# Patient Record
Sex: Male | Born: 1935 | Race: White | Hispanic: No | Marital: Married | Smoking: Never smoker
Health system: Southern US, Community
[De-identification: ages and names within clinical notes are randomized; demographics above are authoritative.]

## PROBLEM LIST (undated history)

## (undated) DIAGNOSIS — C801 Malignant (primary) neoplasm, unspecified: Secondary | ICD-10-CM

## (undated) DIAGNOSIS — E785 Hyperlipidemia, unspecified: Secondary | ICD-10-CM

---

## 1963-07-16 HISTORY — PX: PARTIAL GASTRECTOMY: SHX2172

## 1993-07-15 HISTORY — PX: TRANSURETHRAL RESECTION OF PROSTATE: SHX73

## 2003-07-01 ENCOUNTER — Ambulatory Visit (HOSPITAL_COMMUNITY): Admission: RE | Admit: 2003-07-01 | Discharge: 2003-07-01 | Payer: Self-pay | Admitting: Internal Medicine

## 2005-07-15 HISTORY — PX: CATARACT EXTRACTION: SUR2

## 2006-07-15 HISTORY — PX: CORNEAL TRANSPLANT: SHX108

## 2007-08-03 ENCOUNTER — Ambulatory Visit (HOSPITAL_COMMUNITY): Admission: RE | Admit: 2007-08-03 | Discharge: 2007-08-03 | Payer: Self-pay | Admitting: Internal Medicine

## 2008-08-03 ENCOUNTER — Encounter (HOSPITAL_COMMUNITY): Admission: RE | Admit: 2008-08-03 | Discharge: 2008-11-01 | Payer: Self-pay | Admitting: Internal Medicine

## 2009-05-11 ENCOUNTER — Encounter (HOSPITAL_COMMUNITY): Admission: RE | Admit: 2009-05-11 | Discharge: 2009-06-10 | Payer: Self-pay | Admitting: Urology

## 2009-05-23 ENCOUNTER — Ambulatory Visit: Admission: RE | Admit: 2009-05-23 | Discharge: 2009-06-28 | Payer: Self-pay | Admitting: Radiation Oncology

## 2009-07-15 DIAGNOSIS — C801 Malignant (primary) neoplasm, unspecified: Secondary | ICD-10-CM

## 2009-07-15 HISTORY — DX: Malignant (primary) neoplasm, unspecified: C80.1

## 2009-07-25 ENCOUNTER — Ambulatory Visit
Admission: RE | Admit: 2009-07-25 | Discharge: 2009-10-20 | Payer: Self-pay | Source: Home / Self Care | Admitting: Radiation Oncology

## 2009-08-08 ENCOUNTER — Ambulatory Visit (HOSPITAL_COMMUNITY): Admission: RE | Admit: 2009-08-08 | Discharge: 2009-08-08 | Payer: Self-pay | Admitting: Internal Medicine

## 2010-08-21 ENCOUNTER — Ambulatory Visit (HOSPITAL_COMMUNITY): Payer: Medicare Other | Attending: Internal Medicine

## 2010-08-21 DIAGNOSIS — M810 Age-related osteoporosis without current pathological fracture: Secondary | ICD-10-CM | POA: Insufficient documentation

## 2010-11-30 NOTE — Op Note (Signed)
NAME:  [name], [name]                       ACCOUNT NO.:  [id number]   MEDICAL RECORD NO.:  [id number]                   PATIENT TYPE:  AMB   LOCATION:  DAY                                  FACILITY:  [facility]   PHYSICIAN:  Lionel December, M.D.                 DATE OF BIRTH:  Apr 15, 1926   DATE OF PROCEDURE:  07/01/2003  DATE OF DISCHARGE:                                 OPERATIVE REPORT   PROCEDURE:  Total colonoscopy.   INDICATIONS FOR PROCEDURE:  [name] [name] is a 77 year old Caucasian male who is  undergoing screening colonoscopy.  Family history is negative for colorectal  carcinoma.  He does not have any GI symptoms.  He states he generally has 3-  4 bowel movements per day which has been patterned for years and stools are  generally formed.  The procedure is reviewed with the patient and informed  consent was obtained.   PREOP MEDICATIONS:  Demerol 25 mg IV, Versed 2 mg IV.   FINDINGS:  The procedure performed in endoscopy suite.  The patient's vital  signs and O2 sat were monitored during the procedure and remained stable.  The patient was placed in the left lateral decubitus position and a rectal  examination performed.  No abnormality noted on external or digital exam.  The Olympus videoscope was placed in the rectum and advanced under direct  vision in the sigmoid colon and beyond.  Preparation was excellent.  The  scope was passed in the cecum which was identified by appendiceal orifice  and the ileocecal valve.  Pictures taken for the record. As the scope was  withdrawn, the colonic mucosa was once again carefully examined. There was a  3 mm polyp in the area of the proximal transverse colon which was ablated  via cold biopsy.  The mucosa of the rest of the colon was normal except a  few diverticula at the sigmoid colon which were tiny. The rectal mucosa was  normal. The scope was retroflexed to examine the anorectal junction and  hemorrhoids were noted below the dentate line.  These were felt to be small.  The endoscope was straightened and withdrawn.  The patient tolerated the  procedure well.   FINAL DIAGNOSES:  1. Examination performed to the cecum.  2. Small polyp ablated via cold biopsy from the proximal transverse colon.  3. A few tiny diverticula at the sigmoid colon and small external     hemorrhoids.   RECOMMENDATIONS:  1. High fiber diet.  2. I will be contacting the patient with biopsy results and further     recommendations.      ___________________________________________                                            Lionel December, M.D.   NR/MEDQ  D:  07/01/2003  T:  07/02/2003  Job:  [id number]   cc:   Loraine Leriche A. Waynard Edwards, M.D.  [address]  [place]  [place] [address]  Fax: [phone]

## 2011-01-29 ENCOUNTER — Ambulatory Visit (INDEPENDENT_AMBULATORY_CARE_PROVIDER_SITE_OTHER): Payer: Medicare Other | Admitting: Urology

## 2011-01-29 DIAGNOSIS — C61 Malignant neoplasm of prostate: Secondary | ICD-10-CM

## 2011-06-04 ENCOUNTER — Ambulatory Visit (INDEPENDENT_AMBULATORY_CARE_PROVIDER_SITE_OTHER): Payer: Medicare Other | Admitting: Urology

## 2011-06-04 DIAGNOSIS — C61 Malignant neoplasm of prostate: Secondary | ICD-10-CM

## 2011-09-17 ENCOUNTER — Other Ambulatory Visit (HOSPITAL_COMMUNITY): Payer: Self-pay | Admitting: *Deleted

## 2011-09-18 ENCOUNTER — Ambulatory Visit (HOSPITAL_COMMUNITY)
Admission: RE | Admit: 2011-09-18 | Discharge: 2011-09-18 | Disposition: A | Discharge status: Home / Self Care | Payer: Medicare Other | Source: Ambulatory Visit | Attending: Internal Medicine | Admitting: Internal Medicine

## 2011-09-18 ENCOUNTER — Encounter (HOSPITAL_COMMUNITY): Payer: Self-pay

## 2011-09-18 DIAGNOSIS — M810 Age-related osteoporosis without current pathological fracture: Secondary | ICD-10-CM | POA: Insufficient documentation

## 2011-09-18 HISTORY — DX: Malignant (primary) neoplasm, unspecified: C80.1

## 2011-09-18 HISTORY — DX: Hyperlipidemia, unspecified: E78.5

## 2011-09-18 MED ORDER — ZOLEDRONIC ACID 5 MG/100ML IV SOLN
5.0000 mg | Freq: Once | INTRAVENOUS | Status: AC
  Administered 2011-09-18: 5 mg via INTRAVENOUS
  Filled 2011-09-18: qty 100

## 2011-09-18 MED ORDER — SODIUM CHLORIDE 0.9 % IV SOLN: INTRAVENOUS | Status: AC

## 2011-09-18 NOTE — Discharge Instructions (Signed)
May take tylenol or advil.  Contact MD for jaw pain. Continue drinking fluids today

## 2011-11-26 ENCOUNTER — Ambulatory Visit (INDEPENDENT_AMBULATORY_CARE_PROVIDER_SITE_OTHER): Payer: Medicare Other | Admitting: Urology

## 2011-11-26 DIAGNOSIS — C61 Malignant neoplasm of prostate: Secondary | ICD-10-CM

## 2011-12-02 ENCOUNTER — Telehealth: Payer: Self-pay

## 2012-06-02 ENCOUNTER — Ambulatory Visit (INDEPENDENT_AMBULATORY_CARE_PROVIDER_SITE_OTHER): Payer: Medicare Other | Admitting: Urology

## 2012-06-02 DIAGNOSIS — C61 Malignant neoplasm of prostate: Secondary | ICD-10-CM

## 2012-06-09 ENCOUNTER — Other Ambulatory Visit: Payer: Self-pay | Admitting: Urology

## 2012-06-09 DIAGNOSIS — C61 Malignant neoplasm of prostate: Secondary | ICD-10-CM

## 2012-06-15 ENCOUNTER — Encounter (HOSPITAL_COMMUNITY): Payer: Self-pay

## 2012-06-15 ENCOUNTER — Encounter (HOSPITAL_COMMUNITY)
Admission: RE | Admit: 2012-06-15 | Discharge: 2012-06-15 | Disposition: A | Discharge status: Home / Self Care | Payer: Medicare Other | Source: Ambulatory Visit | Attending: Urology | Admitting: Urology

## 2012-06-15 DIAGNOSIS — C61 Malignant neoplasm of prostate: Secondary | ICD-10-CM | POA: Insufficient documentation

## 2012-06-15 MED ORDER — TECHNETIUM TC 99M MEDRONATE IV KIT
25.0000 | PACK | Freq: Once | INTRAVENOUS | Status: AC | PRN
  Administered 2012-06-15: 27 via INTRAVENOUS

## 2012-06-17 ENCOUNTER — Ambulatory Visit (HOSPITAL_COMMUNITY)
Admission: RE | Admit: 2012-06-17 | Discharge: 2012-06-17 | Disposition: A | Discharge status: Home / Self Care | Payer: Medicare Other | Source: Ambulatory Visit | Attending: Urology | Admitting: Urology

## 2012-06-17 DIAGNOSIS — Z923 Personal history of irradiation: Secondary | ICD-10-CM | POA: Insufficient documentation

## 2012-06-17 DIAGNOSIS — C61 Malignant neoplasm of prostate: Secondary | ICD-10-CM | POA: Insufficient documentation

## 2012-06-17 MED ORDER — IOHEXOL 300 MG/ML  SOLN
100.0000 mL | Freq: Once | INTRAMUSCULAR | Status: AC | PRN
  Administered 2012-06-17: 100 mL via INTRAVENOUS

## 2012-12-01 ENCOUNTER — Ambulatory Visit (INDEPENDENT_AMBULATORY_CARE_PROVIDER_SITE_OTHER): Payer: Medicare Other | Admitting: Urology

## 2012-12-01 DIAGNOSIS — K4090 Unilateral inguinal hernia, without obstruction or gangrene, not specified as recurrent: Secondary | ICD-10-CM

## 2012-12-01 DIAGNOSIS — C61 Malignant neoplasm of prostate: Secondary | ICD-10-CM

## 2012-12-08 ENCOUNTER — Other Ambulatory Visit: Payer: Self-pay | Admitting: Urology

## 2012-12-08 ENCOUNTER — Ambulatory Visit (INDEPENDENT_AMBULATORY_CARE_PROVIDER_SITE_OTHER): Payer: Medicare Other | Admitting: Urology

## 2012-12-08 DIAGNOSIS — C61 Malignant neoplasm of prostate: Secondary | ICD-10-CM

## 2013-01-04 ENCOUNTER — Encounter (HOSPITAL_COMMUNITY): Payer: Self-pay

## 2013-01-04 ENCOUNTER — Encounter (HOSPITAL_COMMUNITY)
Admission: RE | Admit: 2013-01-04 | Discharge: 2013-01-04 | Disposition: A | Discharge status: Home / Self Care | Payer: Medicare Other | Source: Ambulatory Visit | Attending: Urology | Admitting: Urology

## 2013-01-04 DIAGNOSIS — C61 Malignant neoplasm of prostate: Secondary | ICD-10-CM | POA: Insufficient documentation

## 2013-01-04 DIAGNOSIS — R937 Abnormal findings on diagnostic imaging of other parts of musculoskeletal system: Secondary | ICD-10-CM | POA: Insufficient documentation

## 2013-01-04 MED ORDER — TECHNETIUM TC 99M MEDRONATE IV KIT
25.0000 | PACK | Freq: Once | INTRAVENOUS | Status: AC | PRN
  Administered 2013-01-04: 25 via INTRAVENOUS

## 2013-01-19 ENCOUNTER — Ambulatory Visit (INDEPENDENT_AMBULATORY_CARE_PROVIDER_SITE_OTHER): Payer: Medicare Other | Admitting: Urology

## 2013-01-19 DIAGNOSIS — C61 Malignant neoplasm of prostate: Secondary | ICD-10-CM

## 2013-02-08 DIAGNOSIS — N429 Disorder of prostate, unspecified: Secondary | ICD-10-CM | POA: Insufficient documentation

## 2013-02-08 DIAGNOSIS — Z947 Corneal transplant status: Secondary | ICD-10-CM | POA: Insufficient documentation

## 2013-02-09 ENCOUNTER — Ambulatory Visit (INDEPENDENT_AMBULATORY_CARE_PROVIDER_SITE_OTHER): Payer: Medicare Other | Admitting: Urology

## 2013-02-09 DIAGNOSIS — C61 Malignant neoplasm of prostate: Secondary | ICD-10-CM

## 2013-02-17 NOTE — Telephone Encounter (Signed)
error 

## 2013-02-24 ENCOUNTER — Other Ambulatory Visit (INDEPENDENT_AMBULATORY_CARE_PROVIDER_SITE_OTHER): Payer: Medicare Other | Admitting: *Deleted

## 2013-02-24 ENCOUNTER — Encounter: Payer: Self-pay | Admitting: Internal Medicine

## 2013-02-24 DIAGNOSIS — H53129 Transient visual loss, unspecified eye: Secondary | ICD-10-CM

## 2013-02-24 DIAGNOSIS — H53139 Sudden visual loss, unspecified eye: Secondary | ICD-10-CM

## 2013-03-02 ENCOUNTER — Ambulatory Visit (INDEPENDENT_AMBULATORY_CARE_PROVIDER_SITE_OTHER): Payer: Medicare Other | Admitting: Urology

## 2013-03-02 DIAGNOSIS — C61 Malignant neoplasm of prostate: Secondary | ICD-10-CM

## 2013-03-30 ENCOUNTER — Ambulatory Visit: Payer: Medicare Other | Admitting: Urology

## 2013-04-06 ENCOUNTER — Ambulatory Visit (INDEPENDENT_AMBULATORY_CARE_PROVIDER_SITE_OTHER): Payer: Medicare Other | Admitting: Urology

## 2013-04-06 DIAGNOSIS — C61 Malignant neoplasm of prostate: Secondary | ICD-10-CM

## 2013-04-06 DIAGNOSIS — C7951 Secondary malignant neoplasm of bone: Secondary | ICD-10-CM

## 2013-04-27 ENCOUNTER — Ambulatory Visit (INDEPENDENT_AMBULATORY_CARE_PROVIDER_SITE_OTHER): Payer: Medicare Other | Admitting: Urology

## 2013-04-27 DIAGNOSIS — C7951 Secondary malignant neoplasm of bone: Secondary | ICD-10-CM

## 2013-04-27 DIAGNOSIS — C61 Malignant neoplasm of prostate: Secondary | ICD-10-CM

## 2013-04-27 DIAGNOSIS — M810 Age-related osteoporosis without current pathological fracture: Secondary | ICD-10-CM

## 2013-04-27 DIAGNOSIS — E291 Testicular hypofunction: Secondary | ICD-10-CM

## 2013-05-07 ENCOUNTER — Ambulatory Visit (INDEPENDENT_AMBULATORY_CARE_PROVIDER_SITE_OTHER): Payer: Medicare Other | Admitting: Urology

## 2013-05-07 DIAGNOSIS — C61 Malignant neoplasm of prostate: Secondary | ICD-10-CM

## 2013-05-19 DIAGNOSIS — H3582 Retinal ischemia: Secondary | ICD-10-CM | POA: Insufficient documentation

## 2013-05-19 DIAGNOSIS — H40009 Preglaucoma, unspecified, unspecified eye: Secondary | ICD-10-CM | POA: Insufficient documentation

## 2013-06-15 ENCOUNTER — Encounter (INDEPENDENT_AMBULATORY_CARE_PROVIDER_SITE_OTHER): Payer: Self-pay

## 2013-06-15 ENCOUNTER — Ambulatory Visit (INDEPENDENT_AMBULATORY_CARE_PROVIDER_SITE_OTHER): Payer: Medicare Other | Admitting: Urology

## 2013-06-15 DIAGNOSIS — C61 Malignant neoplasm of prostate: Secondary | ICD-10-CM

## 2013-07-27 ENCOUNTER — Ambulatory Visit (INDEPENDENT_AMBULATORY_CARE_PROVIDER_SITE_OTHER): Payer: Medicare Other | Admitting: Urology

## 2013-07-27 DIAGNOSIS — C61 Malignant neoplasm of prostate: Secondary | ICD-10-CM

## 2013-07-27 DIAGNOSIS — C7952 Secondary malignant neoplasm of bone marrow: Secondary | ICD-10-CM

## 2013-07-27 DIAGNOSIS — C7951 Secondary malignant neoplasm of bone: Secondary | ICD-10-CM

## 2013-07-27 DIAGNOSIS — M810 Age-related osteoporosis without current pathological fracture: Secondary | ICD-10-CM

## 2013-08-31 ENCOUNTER — Ambulatory Visit (INDEPENDENT_AMBULATORY_CARE_PROVIDER_SITE_OTHER): Payer: Medicare Other | Admitting: Urology

## 2013-08-31 DIAGNOSIS — C61 Malignant neoplasm of prostate: Secondary | ICD-10-CM

## 2013-10-05 ENCOUNTER — Other Ambulatory Visit: Payer: Self-pay | Admitting: Urology

## 2013-10-05 ENCOUNTER — Ambulatory Visit (INDEPENDENT_AMBULATORY_CARE_PROVIDER_SITE_OTHER): Payer: Medicare Other | Admitting: Urology

## 2013-10-05 DIAGNOSIS — C7951 Secondary malignant neoplasm of bone: Secondary | ICD-10-CM

## 2013-10-05 DIAGNOSIS — C7952 Secondary malignant neoplasm of bone marrow: Secondary | ICD-10-CM

## 2013-10-05 DIAGNOSIS — C61 Malignant neoplasm of prostate: Secondary | ICD-10-CM

## 2013-10-18 ENCOUNTER — Encounter (HOSPITAL_COMMUNITY)
Admission: RE | Admit: 2013-10-18 | Discharge: 2013-10-18 | Disposition: A | Discharge status: Home / Self Care | Payer: Medicare Other | Source: Ambulatory Visit | Attending: Urology | Admitting: Urology

## 2013-10-18 ENCOUNTER — Encounter (HOSPITAL_COMMUNITY): Payer: Self-pay

## 2013-10-18 DIAGNOSIS — C61 Malignant neoplasm of prostate: Secondary | ICD-10-CM | POA: Insufficient documentation

## 2013-10-18 MED ORDER — TECHNETIUM TC 99M MEDRONATE IV KIT
25.0000 | PACK | Freq: Once | INTRAVENOUS | Status: AC | PRN
  Administered 2013-10-18: 25 via INTRAVENOUS

## 2013-11-09 ENCOUNTER — Ambulatory Visit (INDEPENDENT_AMBULATORY_CARE_PROVIDER_SITE_OTHER): Payer: Medicare Other | Admitting: Urology

## 2013-11-09 DIAGNOSIS — C7951 Secondary malignant neoplasm of bone: Secondary | ICD-10-CM

## 2013-11-09 DIAGNOSIS — E291 Testicular hypofunction: Secondary | ICD-10-CM

## 2013-11-09 DIAGNOSIS — C61 Malignant neoplasm of prostate: Secondary | ICD-10-CM

## 2013-11-09 DIAGNOSIS — C7952 Secondary malignant neoplasm of bone marrow: Secondary | ICD-10-CM

## 2013-11-10 ENCOUNTER — Telehealth: Payer: Self-pay | Admitting: Oncology

## 2013-11-10 NOTE — Telephone Encounter (Signed)
S/W PATIENT AND GAVE NP APPT FOR 05/01 @ 10:30 W/DR. SHADAD. [place] DAHLSTEDT DX-PROSTATE CA

## 2013-11-10 NOTE — Telephone Encounter (Signed)
C/D 11/10/13 for appt. 11/12/13

## 2013-11-11 ENCOUNTER — Other Ambulatory Visit: Payer: Self-pay | Admitting: Oncology

## 2013-11-11 DIAGNOSIS — C61 Malignant neoplasm of prostate: Secondary | ICD-10-CM

## 2013-11-12 ENCOUNTER — Encounter: Payer: Self-pay | Admitting: Oncology

## 2013-11-12 ENCOUNTER — Ambulatory Visit: Payer: Medicare Other

## 2013-11-12 ENCOUNTER — Encounter: Payer: Self-pay | Admitting: Medical Oncology

## 2013-11-12 ENCOUNTER — Other Ambulatory Visit (HOSPITAL_BASED_OUTPATIENT_CLINIC_OR_DEPARTMENT_OTHER): Payer: Medicare Other

## 2013-11-12 ENCOUNTER — Ambulatory Visit (HOSPITAL_BASED_OUTPATIENT_CLINIC_OR_DEPARTMENT_OTHER): Payer: Medicare Other | Admitting: Oncology

## 2013-11-12 VITALS — BP 140/65 | HR 91 | Temp 97.9°F | Resp 18 | Ht 69.5 in | Wt 155.9 lb

## 2013-11-12 DIAGNOSIS — C61 Malignant neoplasm of prostate: Secondary | ICD-10-CM

## 2013-11-12 DIAGNOSIS — C7952 Secondary malignant neoplasm of bone marrow: Secondary | ICD-10-CM

## 2013-11-12 DIAGNOSIS — C7951 Secondary malignant neoplasm of bone: Secondary | ICD-10-CM

## 2013-11-12 DIAGNOSIS — E785 Hyperlipidemia, unspecified: Secondary | ICD-10-CM

## 2013-11-12 DIAGNOSIS — M109 Gout, unspecified: Secondary | ICD-10-CM

## 2013-11-12 LAB — CBC WITH DIFFERENTIAL/PLATELET
BASO%: 0.6 % (ref 0.0–2.0)
Basophils Absolute: 0 10*3/uL (ref 0.0–0.1)
EOS%: 2 % (ref 0.0–7.0)
Eosinophils Absolute: 0.1 10*3/uL (ref 0.0–0.5)
HCT: 41.5 % (ref 38.4–49.9)
HGB: 13.5 g/dL (ref 13.0–17.1)
LYMPH%: 10.7 % — AB (ref 14.0–49.0)
MCH: 29.7 pg (ref 27.2–33.4)
MCHC: 32.5 g/dL (ref 32.0–36.0)
MCV: 91.2 fL (ref 79.3–98.0)
MONO#: 1 10*3/uL — ABNORMAL HIGH (ref 0.1–0.9)
MONO%: 13.9 % (ref 0.0–14.0)
NEUT%: 72.8 % (ref 39.0–75.0)
NEUTROS ABS: 5.2 10*3/uL (ref 1.5–6.5)
Platelets: 265 10*3/uL (ref 140–400)
RBC: 4.55 10*6/uL (ref 4.20–5.82)
RDW: 12.5 % (ref 11.0–14.6)
WBC: 7.2 10*3/uL (ref 4.0–10.3)
lymph#: 0.8 10*3/uL — ABNORMAL LOW (ref 0.9–3.3)

## 2013-11-12 LAB — COMPREHENSIVE METABOLIC PANEL (CC13)
ALBUMIN: 3.5 g/dL (ref 3.5–5.0)
ALK PHOS: 80 U/L (ref 40–150)
ALT: 19 U/L (ref 0–55)
AST: 42 U/L — ABNORMAL HIGH (ref 5–34)
Anion Gap: 10 mEq/L (ref 3–11)
BUN: 18.2 mg/dL (ref 7.0–26.0)
CHLORIDE: 104 meq/L (ref 98–109)
CO2: 28 meq/L (ref 22–29)
CREATININE: 1.2 mg/dL (ref 0.7–1.3)
Calcium: 10.2 mg/dL (ref 8.4–10.4)
Glucose: 86 mg/dl (ref 70–140)
POTASSIUM: 4.9 meq/L (ref 3.5–5.1)
Sodium: 141 mEq/L (ref 136–145)
Total Bilirubin: 0.44 mg/dL (ref 0.20–1.20)
Total Protein: 7.3 g/dL (ref 6.4–8.3)

## 2013-11-12 MED ORDER — ENZALUTAMIDE 40 MG PO CAPS: 160.0000 mg | ORAL_CAPSULE | Freq: Every day | ORAL | Status: DC

## 2013-11-12 NOTE — Consult Note (Signed)
Reason for Referral: Prostate cancer.   HPI: 80 year old gentleman currently of [place], [place] where he currently resides with his wife. He is a gentleman with a past medical history significant for gout and hyperlipidemia prostate cancer history that dates back to 2010. At that time he presented with an elevated PSA of 9.43 and was evaluated by Dr. Luberta Robertson and a biopsy confirmed the presence of prostate adenocarcinoma. His biopsy showed a Gleason score 4+3 equals 7 in 7/12 cores. His staging workup did not reveal any advanced disease at that time. He was treated with hormonal therapy initially and subsequently underwent IMRT.radiotherapy that was concluded oi April of 2011. His PSA nadir was down to 0.08 that remained low till about May of 2013. His PSA started to rise and was up to 39 in May of 2014. He was started on Firmagon and subsequently Lupron. His PSA went down to 5.23 at its lowest. He had a bone scan in june of 2014 which showed bony metastasis in the right iliac bone and the right greater trochanter. He was also started on Casodex as well. He was continued on Xgeva monthly as well. Most recently he developed a rise in his PSA initially to 8.4 and then subsequently to 19.23 in 11/01/2013. A repeat bone scan on 10/18/2013 did not show any bony metastasis. Patient referred 3 for the evaluation for appears to be castration resistant disease. Clinically, he has very few symptoms at this time. Does have about 25 pound weight loss which have stabilized at this time. He does report some knee pain but otherwise no musca skeletal complaint. Is not reporting any headache blurry vision or double vision. Does not report any seizure activity or history. He does not report any hip pain or shoulder pain or back pain. He does report some voiding symptoms but no hematuria or dysuria. He continues to live independently with his wife in attendance activities of daily living. He continues to drive and  take care of his house affairs. He continues to have excellent performance status and quality of life.  Past Medical History  Diagnosis Date  . Cancer 2011    prostate w radiation  . Hyperlipemia   . Osteoporosis   . Hypertension   :  Past Surgical History  Procedure Laterality Date  . Partial gastrectomy  1965  . Transurethral resection of prostate  1995  . Cataract extraction  2007    Bilaterally  . Corneal transplant  2008    Bilaterally  :   Current Outpatient Prescriptions  Medication Sig Dispense Refill  . aspirin 81 MG tablet Take 81 mg by mouth daily. Mon, wed, fri      . calcium gluconate 500 MG tablet Take 250 mg by mouth daily.      . Calcium-Vitamin D-Vitamin K 315-176-16 MG-UNT-MCG CHEW Chew by mouth daily.      . fish oil-omega-3 fatty acids 1000 MG capsule Take 1,250 mg by mouth daily.      Marland Kitchen latanoprost (XALATAN) 0.005 % ophthalmic solution Place 1 drop into both eyes.       Marland Kitchen MISC NATURAL PRODUCTS PO Take by mouth daily. Activia      . Multiple Vitamin (DAILY VITAMIN FORMULA PO) Take by mouth daily. OTC Men's One a Day (10 key nutrients).      . prednisoLONE acetate (PRED FORTE) 1 % ophthalmic suspension Place 1 drop into both eyes Nightly.      . thiamine (VITAMIN B-1) 50 MG tablet Take 50 mg  by mouth daily.      . Vitamin D, Ergocalciferol, (DRISDOL) 50000 UNITS CAPS Take 50,000 Units by mouth every 14 (fourteen) days.      . bicalutamide (CASODEX) 50 MG tablet       . enzalutamide (XTANDI) 40 MG capsule Take 4 capsules (160 mg total) by mouth daily.  120 capsule  0   No current facility-administered medications for this visit.      Allergies  Allergen Reactions  . Statins     Muscle weakness in arms and legs  :  No family history on file.:  History   Social History  . Marital Status: Married    Spouse Name: N/A    Number of Children: N/A  . Years of Education: N/A   Occupational History  . Not on file.   Social History Main Topics  .  Smoking status: Not on file  . Smokeless tobacco: Not on file  . Alcohol Use: Not on file  . Drug Use: Not on file  . Sexual Activity: Not on file   Other Topics Concern  . Not on file   Social History Narrative  . No narrative on file  :  Constitutional: positive for weight loss, negative for anorexia, chills and sweats Eyes: negative for icterus, irritation and redness Ears, nose, mouth, throat, and face: negative for ear drainage, hoarseness and nasal congestion Respiratory: negative for asthma and wheezing Cardiovascular: negative for chest pain, exertional chest pressure/discomfort and paroxysmal nocturnal dyspnea Gastrointestinal: negative for abdominal pain, nausea and vomiting Genitourinary:negative for dysuria, frequency and hematuria Integument/breast: negative for pruritus and rash Hematologic/lymphatic: negative for easy bruising, lymphadenopathy and petechiae Musculoskeletal:negative for arthralgias and stiff joints Neurological: negative for dizziness and headaches Behavioral/Psych: negative for anxiety and depression Endocrine: negative for temperature intolerance Allergic/Immunologic: negative for urticaria  Exam: ECOG 1 Blood pressure 140/65, pulse 91, temperature 97.9 F (36.6 C), temperature source Oral, resp. rate 18, height 5' 9.5" (1.765 m), weight 155 lb 14.4 oz (70.716 kg), SpO2 97.00%. General appearance: alert and cooperative Nose: Nares normal. Septum midline. Mucosa normal. No drainage or sinus tenderness. Throat: lips, mucosa, and tongue normal; teeth and gums normal Neck: no adenopathy, no carotid bruit, no JVD, supple, symmetrical, trachea midline and thyroid not enlarged, symmetric, no tenderness/mass/nodules Back: symmetric, no curvature. ROM normal. No CVA tenderness. Resp: clear to auscultation bilaterally Chest wall: no tenderness Cardio: regular rate and rhythm, S1, S2 normal, no murmur, click, rub or gallop GI: soft, non-tender; bowel  sounds normal; no masses,  no organomegaly Extremities: extremities normal, atraumatic, no cyanosis or edema Pulses: 2+ and symmetric Skin: Skin color, texture, turgor normal. No rashes or lesions Lymph nodes: Cervical, supraclavicular, and axillary nodes normal. Neurologic: Grossly normal   Recent Labs  11/12/13 1028  WBC 7.2  HGB 13.5  HCT 41.5  PLT 265    Recent Labs  11/12/13 1028  NA 141  K 4.9  CO2 28  GLUCOSE 86  BUN 18.2  CREATININE 1.2  CALCIUM 10.2    Nm Bone Scan Whole Body  10/18/2013   CLINICAL DATA:  Prostate cancer.  EXAM: NUCLEAR MEDICINE WHOLE BODY BONE SCAN  TECHNIQUE: Whole body anterior and posterior images were obtained approximately 3 hours after intravenous injection of radiopharmaceutical.  RADIOPHARMACEUTICALS:  25 mCi Technetium-99 MDP  COMPARISON:  Bone scan 01/04/2013.  FINDINGS: Bilateral renal function and excretion. No acute bony abnormality identified.  IMPRESSION: No focal abnormality.  No evidence of metastatic disease.   Electronically Signed  By: [place]   On: 10/18/2013 14:14    Assessment and Plan:   80 year old gentleman with the following issues:  1. Castration resistant prostate cancer with metastatic disease to the bone. His initial diagnosis was in 2010 with a Gleason score 4+3 equals 7 and a PSA of 9.43. He was treated with definitive radiation therapy after a brief period of hormone treatments with a PSA nadir of 0.08 for 2 years. He subsequently developed bony metastasis and 2014 and a PSA up to 39. He was treated with androgen depravation and a PSA nadir to up to 5.23 and most recently his PSA is elevated again up to 19.23. His bone scan recently did not show any new disease. The natural course of prostate cancer in general was discussed and more specifically estrogen resistant disease. At this point, I have explained to him that the rise in his PSA could represent microscopic metastasis versus involvement in the lymph  nodes or visceral organs. To determine this fully, we will obtain a CT scan of the chest abdomen and pelvis. Treatment options were discussed today includes a second line hormonal manipulation such as ketoconazole, Zytiga, Xtandi, a meal with therapy such as Provenge and systemic chemotherapy. The risks and benefits of always agents were discussed and we feel that Gillermina Phy is the best option for him at this time. He is interested in quality of life more than quantity at this point and would like to minimize side effects as much as possible. Complications from Gillermina Phy was discussed today including rarely seizure activity, nausea and vomiting, fatigue and tiredness, lower extremity and rarely hematological toxicities. Written information was given to the patient and will anticipate him starting on this medication in the near future.  2. Androgen depravation: I discussed continuing this with him and he is in agreement and he will continue to receive that with Dr. Luberta Robertson.  3. Bone health: He will continue Exjade the under the care of Dr. Luberta Robertson on a monthly basis.  4. Followup: Will be in about a month to discuss the results of the CT scan and assess toxicities of Xtandi.

## 2013-11-12 NOTE — Progress Notes (Signed)
Checked in new pt with no financial concerns. °

## 2013-11-12 NOTE — Progress Notes (Signed)
Prescription for Xtandi given to [name] [name], managed care, for preauthorization.

## 2013-11-12 NOTE — Progress Notes (Signed)
RECEIVED A FAX FROM [facility] CONCERNING A CONFIRMATION OF FACSIMILE RECEIPT FOR PT. REFERRAL. 

## 2013-11-12 NOTE — Progress Notes (Signed)
Please see consult note.  

## 2013-11-12 NOTE — Progress Notes (Signed)
Faxed xtandi prescription to [facility] °

## 2013-11-13 LAB — TESTOSTERONE: Testosterone: 32 ng/dL — ABNORMAL LOW (ref 300–890)

## 2013-11-13 LAB — PSA: PSA: 29.64 ng/mL — AB (ref <=4.00)

## 2013-11-15 ENCOUNTER — Encounter: Payer: Self-pay | Admitting: Oncology

## 2013-11-15 NOTE — Progress Notes (Signed)
[facility], [id number], approved xtandi from 11/15/13-11/16/14

## 2013-11-17 ENCOUNTER — Telehealth: Payer: Self-pay | Admitting: Medical Oncology

## 2013-11-17 NOTE — Telephone Encounter (Signed)
Patient with question of when to start [place]. Spoke with Carmelina Noun in managed care regarding status of prescription and mssg relayed to me.  Reviewed with MD, patient to wait to start Xtandi till next office visit with MD on June 3. LVMOM with patient, requested that he return call to verify receipt.

## 2013-11-17 NOTE — Telephone Encounter (Signed)
Patient LVMOM regarding Xtandi. Return call to patient and spoke with spouse that pt is to wait on taking Xtandi until after Dr Alen Blew see's pt in June 3. Spouse expressed understanding, knows to call office with questions/concerns.

## 2013-11-19 ENCOUNTER — Telehealth: Payer: Self-pay | Admitting: Dietician

## 2013-11-19 NOTE — Telephone Encounter (Signed)
Brief Outpatient Oncology Nutrition Note  Patient has been identified to be at risk on malnutrition screen.  Wt Readings from Last 10 Encounters:  11/12/13 155 lb 14.4 oz (70.716 kg)  09/18/11 173 lb 5.9 oz (78.64 kg)      Dx:  Prostate Cancer,  Patient of Dr. Alen Blew.  Called patient due to weight loss.  Spoke with wife who reported that intake is very good and believes that patient has regained weight.  She is concerned secondary to feeling that weight lost was muscle.  Discussed increased protein needs and sources.  [place] RD available as needed/  Antonieta Iba, RD, LDN Clinical Inpatient Dietitian Pager:  [phone] Weekend and after hours pager:  [phone]

## 2013-11-23 ENCOUNTER — Encounter: Payer: Self-pay | Admitting: Oncology

## 2013-11-23 ENCOUNTER — Ambulatory Visit (HOSPITAL_COMMUNITY)
Admission: RE | Admit: 2013-11-23 | Discharge: 2013-11-23 | Disposition: A | Discharge status: Home / Self Care | Payer: Medicare Other | Source: Ambulatory Visit | Attending: Oncology | Admitting: Oncology

## 2013-11-23 DIAGNOSIS — C61 Malignant neoplasm of prostate: Secondary | ICD-10-CM | POA: Insufficient documentation

## 2013-11-23 DIAGNOSIS — K4090 Unilateral inguinal hernia, without obstruction or gangrene, not specified as recurrent: Secondary | ICD-10-CM | POA: Insufficient documentation

## 2013-11-23 DIAGNOSIS — M949 Disorder of cartilage, unspecified: Secondary | ICD-10-CM

## 2013-11-23 DIAGNOSIS — I2510 Atherosclerotic heart disease of native coronary artery without angina pectoris: Secondary | ICD-10-CM | POA: Insufficient documentation

## 2013-11-23 DIAGNOSIS — M899 Disorder of bone, unspecified: Secondary | ICD-10-CM | POA: Insufficient documentation

## 2013-11-23 MED ORDER — IOHEXOL 300 MG/ML  SOLN
100.0000 mL | Freq: Once | INTRAMUSCULAR | Status: AC | PRN
  Administered 2013-11-23: 100 mL via INTRAVENOUS

## 2013-11-23 MED ORDER — SODIUM CHLORIDE 0.9 % IJ SOLN
INTRAMUSCULAR | Status: AC
  Filled 2013-11-23: qty 750

## 2013-11-23 NOTE — Progress Notes (Signed)
Per [place] at [facility] patient approved for Xtandi 11/16/13-11/16/14  11000.00. I will advise billing and sent to medical records.

## 2013-12-10 ENCOUNTER — Ambulatory Visit (INDEPENDENT_AMBULATORY_CARE_PROVIDER_SITE_OTHER): Payer: Medicare Other | Admitting: Urology

## 2013-12-10 DIAGNOSIS — C7951 Secondary malignant neoplasm of bone: Secondary | ICD-10-CM

## 2013-12-10 DIAGNOSIS — C7952 Secondary malignant neoplasm of bone marrow: Secondary | ICD-10-CM

## 2013-12-10 DIAGNOSIS — C61 Malignant neoplasm of prostate: Secondary | ICD-10-CM

## 2013-12-15 ENCOUNTER — Telehealth: Payer: Self-pay | Admitting: Oncology

## 2013-12-15 ENCOUNTER — Other Ambulatory Visit: Payer: Self-pay | Admitting: Medical Oncology

## 2013-12-15 ENCOUNTER — Encounter: Payer: Self-pay | Admitting: Oncology

## 2013-12-15 ENCOUNTER — Ambulatory Visit (HOSPITAL_BASED_OUTPATIENT_CLINIC_OR_DEPARTMENT_OTHER): Payer: Medicare Other | Admitting: Oncology

## 2013-12-15 ENCOUNTER — Other Ambulatory Visit (HOSPITAL_BASED_OUTPATIENT_CLINIC_OR_DEPARTMENT_OTHER): Payer: Medicare Other

## 2013-12-15 VITALS — BP 137/50 | HR 82 | Temp 97.8°F | Resp 18 | Ht 69.5 in | Wt 155.5 lb

## 2013-12-15 DIAGNOSIS — C7952 Secondary malignant neoplasm of bone marrow: Secondary | ICD-10-CM

## 2013-12-15 DIAGNOSIS — E291 Testicular hypofunction: Secondary | ICD-10-CM

## 2013-12-15 DIAGNOSIS — C61 Malignant neoplasm of prostate: Secondary | ICD-10-CM

## 2013-12-15 DIAGNOSIS — C7951 Secondary malignant neoplasm of bone: Secondary | ICD-10-CM

## 2013-12-15 LAB — COMPREHENSIVE METABOLIC PANEL (CC13)
ALK PHOS: 81 U/L (ref 40–150)
ALT: 21 U/L (ref 0–55)
AST: 28 U/L (ref 5–34)
Albumin: 3.4 g/dL — ABNORMAL LOW (ref 3.5–5.0)
Anion Gap: 14 mEq/L — ABNORMAL HIGH (ref 3–11)
BILIRUBIN TOTAL: 0.42 mg/dL (ref 0.20–1.20)
BUN: 21.4 mg/dL (ref 7.0–26.0)
CO2: 24 mEq/L (ref 22–29)
Calcium: 9.8 mg/dL (ref 8.4–10.4)
Chloride: 103 mEq/L (ref 98–109)
Creatinine: 1.3 mg/dL (ref 0.7–1.3)
GLUCOSE: 149 mg/dL — AB (ref 70–140)
Potassium: 4.7 mEq/L (ref 3.5–5.1)
Sodium: 141 mEq/L (ref 136–145)
Total Protein: 7.1 g/dL (ref 6.4–8.3)

## 2013-12-15 LAB — CBC WITH DIFFERENTIAL/PLATELET
BASO%: 0.5 % (ref 0.0–2.0)
BASOS ABS: 0 10*3/uL (ref 0.0–0.1)
EOS%: 2.1 % (ref 0.0–7.0)
Eosinophils Absolute: 0.2 10*3/uL (ref 0.0–0.5)
HCT: 39.5 % (ref 38.4–49.9)
HEMOGLOBIN: 12.9 g/dL — AB (ref 13.0–17.1)
LYMPH%: 9.8 % — AB (ref 14.0–49.0)
MCH: 29.8 pg (ref 27.2–33.4)
MCHC: 32.7 g/dL (ref 32.0–36.0)
MCV: 91.4 fL (ref 79.3–98.0)
MONO#: 0.7 10*3/uL (ref 0.1–0.9)
MONO%: 9.5 % (ref 0.0–14.0)
NEUT#: 5.9 10*3/uL (ref 1.5–6.5)
NEUT%: 78.1 % — ABNORMAL HIGH (ref 39.0–75.0)
PLATELETS: 244 10*3/uL (ref 140–400)
RBC: 4.32 10*6/uL (ref 4.20–5.82)
RDW: 13.2 % (ref 11.0–14.6)
WBC: 7.5 10*3/uL (ref 4.0–10.3)
lymph#: 0.7 10*3/uL — ABNORMAL LOW (ref 0.9–3.3)

## 2013-12-15 MED ORDER — ENZALUTAMIDE 40 MG PO CAPS
160.0000 mg | ORAL_CAPSULE | Freq: Every day | ORAL | Status: DC
Start: 2013-12-15 — End: 2014-01-06

## 2013-12-15 NOTE — Telephone Encounter (Signed)
Per MD,  Call to [facility] to start Xtandi. [facility] rep states will call patient to set-up delivery date. Call to patient, spoke with spouse, to inform them to expect call from [facility] regarding delivery date for [facility]. Spouse expressed understanding, knows to call office with further questions or concerns.

## 2013-12-15 NOTE — Telephone Encounter (Signed)
gv adn printed appt sched and avs for pt for July  °

## 2013-12-15 NOTE — Progress Notes (Signed)
Hematology and Oncology Follow Up Visit  [name] [name] [id number] 02/27/1926 80 y.o. 12/15/2013 3:48 PM   Principle Diagnosis: 80 year old gentleman with castration resistant metastatic disease to the bone. His initial diagnosis was in 2010 with a Gleason score 4+3 equals 7 and a PSA of 9.43.   Prior Therapy:  He was treated with definitive radiation therapy after a brief period of hormone treatments with a PSA nadir of 0.08 for 2 years.  He subsequently developed bony metastasis and 2014 and a PSA up to 39. He was treated with androgen depravation and a PSA nadir to up to 5.23 and most recently his PSA is elevated again up to 29.64. His bone scan recently did not show any new disease.  Current therapy: Androgen depravation therapy in the form of Lupron under the care of Dr. Luberta Robertson. Xgeva given under the care of Dr. Luberta Robertson. Under consideration to start Xtandi in the near future.  Interim History:  [name] [name] presents today for a followup visit. He is a nice man with the above history and developed castration resistant prostate cancer. He has metastatic disease to the bone but his CT scan chest abdomen and pelvis done on 11/23/2013 showed no visceral metastasis. He has been approved for Xtandi the medication has not been shipped to him yet. He continued to be asymptomatic at this time I am doing relatively well. He does not report any headaches or blurry vision or double vision. Does that report any back pain shoulder pain right hip pain. And despite his age, continued to be relatively in reasonable health and shape. His performance status remained stable.  Medications: I have reviewed the patient's current medications.  Current Outpatient Prescriptions  Medication Sig Dispense Refill  . aspirin 81 MG tablet Take 81 mg by mouth daily. Mon, wed, fri      . Calcium-Vitamin D-Vitamin K [id number] MG-UNT-MCG CHEW Chew by mouth daily.      . enzalutamide (XTANDI) 40 MG capsule Take 4  capsules (160 mg total) by mouth daily.  120 capsule  0  . fish oil-omega-3 fatty acids 1000 MG capsule Take 1,250 mg by mouth daily.      Marland Kitchen latanoprost (XALATAN) 0.005 % ophthalmic solution Place 1 drop into both eyes.       . Multiple Vitamin (DAILY VITAMIN FORMULA PO) Take by mouth daily. OTC Men's One a Day (10 key nutrients).      . prednisoLONE acetate (PRED FORTE) 1 % ophthalmic suspension Place 1 drop into both eyes Nightly.      . thiamine (VITAMIN B-1) 50 MG tablet Take 50 mg by mouth daily.      . Vitamin D, Ergocalciferol, (DRISDOL) 50000 UNITS CAPS Take 50,000 Units by mouth every 14 (fourteen) days.      . calcium gluconate 500 MG tablet Take 250 mg by mouth daily.      Marland Kitchen MISC NATURAL PRODUCTS PO Take by mouth daily. Activia       No current facility-administered medications for this visit.     Allergies:  Allergies  Allergen Reactions  . Statins     Muscle weakness in arms and legs    Past Medical History, Surgical history, Social history, and Family History were reviewed and updated.  Review of Systems: Constitutional:  Negative for fever, chills, night sweats, anorexia, weight loss, pain. Cardiovascular: no chest pain or dyspnea on exertion Respiratory: no cough, shortness of breath, or wheezing Neurological: no TIA or stroke symptoms Dermatological: negative ENT: negative Skin:  Negative. Gastrointestinal: no abdominal pain, change in bowel habits, or black or bloody stools Genito-Urinary: no dysuria, trouble voiding, or hematuria Hematological and Lymphatic: negative Breast: negative Musculoskeletal: negative Remaining ROS negative. Physical Exam: Blood pressure 137/50, pulse 82, temperature 97.8 F (36.6 C), temperature source Oral, resp. rate 18, height 5' 9.5" (1.765 m), weight 155 lb 8 oz (70.534 kg). ECOG:  General appearance: alert and cooperative Head: Normocephalic, without obvious abnormality Neck: no adenopathy and thyroid not enlarged,  symmetric, no tenderness/mass/nodules Lymph nodes: Cervical, supraclavicular, and axillary nodes normal. Heart:regular rate and rhythm, S1, S2 normal, no murmur, click, rub or gallop Lung:chest clear, no wheezing, rales, normal symmetric air entry Abdomin: soft, non-tender, without masses or organomegaly EXT:no erythema, induration, or nodules   Lab Results: Lab Results  Component Value Date   WBC 7.5 12/15/2013   HGB 12.9* 12/15/2013   HCT 39.5 12/15/2013   MCV 91.4 12/15/2013   PLT 244 12/15/2013     Chemistry      Component Value Date/Time   NA 141 12/15/2013 1447   K 4.7 12/15/2013 1447   CO2 24 12/15/2013 1447   BUN 21.4 12/15/2013 1447   CREATININE 1.3 12/15/2013 1447      Component Value Date/Time   CALCIUM 9.8 12/15/2013 1447   ALKPHOS 81 12/15/2013 1447   AST 28 12/15/2013 1447   ALT 21 12/15/2013 1447   BILITOT 0.42 12/15/2013 1447       Radiological Studies:  EXAM:  CT CHEST, ABDOMEN, AND PELVIS WITH CONTRAST  TECHNIQUE:  Multidetector CT imaging of the chest, abdomen and pelvis was  performed following the standard protocol during bolus  administration of intravenous contrast.  CONTRAST: 159mL OMNIPAQUE IOHEXOL 300 MG/ML SOLN  COMPARISON: CT abdomen and pelvis 07/07/2012. Nuclear medicine  whole-body bone scan 10/18/2013.  FINDINGS:  CT CHEST FINDINGS  Pleural/parenchymal densities are noted in the apices bilaterally  compatible with scarring. Mild emphysematous changes. Linear  scarring at the right lung base in the right lower lobe. No  suspicious pulmonary nodules. No pleural effusions.  Heart is normal size. Diffuse dense coronary artery calcifications.  Aorta is normal caliber.  Small and borderline sized mediastinal lymph nodes. Pretracheal  lymph node has a short axis diameter of 9 mm on image 27. No  adenopathy. Chest wall soft tissues are unremarkable.  Predominantly sclerotic area noted anteriorly within the L1  vertebral body. This is new since 2013 CT. While this  area does not  demonstrate increased activity on recent bone scan, this is most  compatible with sclerotic metastasis. Other apparent small sclerotic  areas noted on axial imaging are demonstrated on sagittal imaging to  be associated with the endplates or disc spaces and are likely  degenerative.  CT ABDOMEN AND PELVIS FINDINGS  Liver, gallbladder, spleen, adrenals and kidneys are unremarkable.  Scattered calcifications throughout the pancreas could be vascular  or related to chronic pancreatitis. Stable postsurgical changes in  the region of the stomach. Moderate stool throughout the colon.  Scattered left colonic diverticulosis without active diverticulitis.  Small bowel is decompressed. There is a right inguinal hernia  containing a knuckle of small bowel, stable since prior study. No  evidence of bowel obstruction. Small ventral hernia on image 80  contains fat, stable.  No free fluid, free air or adenopathy. Aorta and branch vessels are  heavily calcified. No aneurysm. Surgical clips in the region of the  prostate. Urinary bladder is grossly unremarkable.  Sclerotic lesion within the right iliac bone  is concerning for bony  metastatic disease. This measures up to 6.7 cm on image 106.  IMPRESSION:  Sclerotic areas within the anterior L1 vertebral body and right  iliac bone compatible with sclerotic metastases.  Severe diffuse coronary artery calcifications.  Right inguinal hernia containing a small knuckle of small bowel,  stable. No evidence of bowel obstruction.    Impression and Plan:  80 year old gentleman with the following issues:  1. Castration resistant prostate cancer with metastatic disease to the bone. The results of the CT scan was discussed and appears to have sclerotic lesion at L1 and right iliac bone. No visceral metastasis however. I still favor starting Xtandi which I have discussed with him today. The risks and benefits again were discussed and he is agreeable  to proceed. He will start this medication as soon as it should to him which should be in the near future. He will take a total 160 mg daily.  2. Androgen depravation: he will continue to receive that with Dr. Luberta Robertson.  3.Bone health: He will continue Xgeva the under the care of Dr. Luberta Robertson on a monthly basis.  4. Followup: He will be in 4-5 weeks to assess any toxicities related to [place].      Wyatt Portela, MD 6/3/20153:48 PM

## 2013-12-16 LAB — PSA: PSA: 50.48 ng/mL — AB (ref <=4.00)

## 2013-12-17 NOTE — Telephone Encounter (Signed)
RECEIVED A FAX FROM [facility] CONCERNING A CONFIRMATION OF PRESCRIPTION SHIPMENT FOR XTANDI ON 12/16/13.

## 2013-12-22 ENCOUNTER — Encounter: Payer: Self-pay | Admitting: Specialist

## 2013-12-22 NOTE — Progress Notes (Signed)
RN referral because patient stated that he was an "8" on the distress screen. Spoke to him by phone. He is mostly stressed by situations at his [facility] that have led him to want to go to another denomination. It is a significant stressor for him because he has been in this church all his life and he and his wife have many friends there. Chaplain listened and helped him clarify the issues that are stressing him; encouraged and supported his decision to remain committed to a connection to a faith community since this is a major support/coping resource for him. Offered support (listening) for both he and his spouse in this time of transition. He requested prayer for wisdom and discernment. Prayed with him. Will continue to support as needed.  Epifania Gore, PhD, [place]

## 2014-01-06 ENCOUNTER — Other Ambulatory Visit: Payer: Self-pay | Admitting: *Deleted

## 2014-01-06 MED ORDER — ENZALUTAMIDE 40 MG PO CAPS: 160.0000 mg | ORAL_CAPSULE | Freq: Every day | ORAL | Status: DC

## 2014-01-06 NOTE — Telephone Encounter (Signed)
THIS REFILL REQUEST FOR XTANDI WAS PLACED IN DR.SHADAD'S ACTIVE WORK FOLDER. 

## 2014-01-07 NOTE — Telephone Encounter (Signed)
RECEIVED A FAX FROM [facility] CONCERNING A CONFIRMATION OF FACSIMILE RECEIPT FOR PT. REFERRAL. 

## 2014-01-11 ENCOUNTER — Ambulatory Visit (INDEPENDENT_AMBULATORY_CARE_PROVIDER_SITE_OTHER): Payer: Medicare Other | Admitting: Urology

## 2014-01-11 DIAGNOSIS — C61 Malignant neoplasm of prostate: Secondary | ICD-10-CM

## 2014-01-12 ENCOUNTER — Telehealth: Payer: Self-pay | Admitting: Oncology

## 2014-01-12 ENCOUNTER — Encounter: Payer: Self-pay | Admitting: Physician Assistant

## 2014-01-12 ENCOUNTER — Other Ambulatory Visit (HOSPITAL_BASED_OUTPATIENT_CLINIC_OR_DEPARTMENT_OTHER): Payer: Medicare Other

## 2014-01-12 ENCOUNTER — Ambulatory Visit (HOSPITAL_BASED_OUTPATIENT_CLINIC_OR_DEPARTMENT_OTHER): Payer: Medicare Other | Admitting: Physician Assistant

## 2014-01-12 VITALS — BP 163/73 | HR 80 | Temp 97.6°F | Resp 18 | Ht 69.5 in | Wt 153.1 lb

## 2014-01-12 DIAGNOSIS — C61 Malignant neoplasm of prostate: Secondary | ICD-10-CM

## 2014-01-12 DIAGNOSIS — C7952 Secondary malignant neoplasm of bone marrow: Secondary | ICD-10-CM

## 2014-01-12 DIAGNOSIS — C7951 Secondary malignant neoplasm of bone: Secondary | ICD-10-CM

## 2014-01-12 LAB — COMPREHENSIVE METABOLIC PANEL (CC13)
ALT: 15 U/L (ref 0–55)
AST: 23 U/L (ref 5–34)
Albumin: 3.6 g/dL (ref 3.5–5.0)
Alkaline Phosphatase: 89 U/L (ref 40–150)
Anion Gap: 10 mEq/L (ref 3–11)
BILIRUBIN TOTAL: 0.48 mg/dL (ref 0.20–1.20)
BUN: 17.2 mg/dL (ref 7.0–26.0)
CALCIUM: 10.3 mg/dL (ref 8.4–10.4)
CO2: 27 mEq/L (ref 22–29)
CREATININE: 1.1 mg/dL (ref 0.7–1.3)
Chloride: 105 mEq/L (ref 98–109)
GLUCOSE: 61 mg/dL — AB (ref 70–140)
Potassium: 4.5 mEq/L (ref 3.5–5.1)
Sodium: 143 mEq/L (ref 136–145)
TOTAL PROTEIN: 7.6 g/dL (ref 6.4–8.3)

## 2014-01-12 LAB — CBC WITH DIFFERENTIAL/PLATELET
BASO%: 0.7 % (ref 0.0–2.0)
Basophils Absolute: 0.1 10*3/uL (ref 0.0–0.1)
EOS ABS: 0.1 10*3/uL (ref 0.0–0.5)
EOS%: 1.4 % (ref 0.0–7.0)
HEMATOCRIT: 44.2 % (ref 38.4–49.9)
HEMOGLOBIN: 14.1 g/dL (ref 13.0–17.1)
LYMPH#: 0.7 10*3/uL — AB (ref 0.9–3.3)
LYMPH%: 9.4 % — AB (ref 14.0–49.0)
MCH: 29.7 pg (ref 27.2–33.4)
MCHC: 31.9 g/dL — ABNORMAL LOW (ref 32.0–36.0)
MCV: 93.2 fL (ref 79.3–98.0)
MONO#: 0.9 10*3/uL (ref 0.1–0.9)
MONO%: 12.4 % (ref 0.0–14.0)
NEUT%: 76.1 % — AB (ref 39.0–75.0)
NEUTROS ABS: 5.7 10*3/uL (ref 1.5–6.5)
PLATELETS: 289 10*3/uL (ref 140–400)
RBC: 4.74 10*6/uL (ref 4.20–5.82)
RDW: 14 % (ref 11.0–14.6)
WBC: 7.5 10*3/uL (ref 4.0–10.3)

## 2014-01-12 NOTE — Telephone Encounter (Signed)
, °

## 2014-01-12 NOTE — Progress Notes (Signed)
Hematology and Oncology Follow Up Visit  [name] [name] [id number] March 02, 1926 80 y.o. 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 His initial diagnosis was in 2010 with a Gleason score 4+3 equals 7 and a PSA of 9.43.   Prior Therapy:  He was treated with definitive radiation therapy after a brief period of hormone treatments with a PSA nadir of 0.08 for 2 years.  He subsequently developed bony metastasis and 2014 and a PSA up to 39. He was treated with androgen depravation and a PSA nadir to up to 5.23 and most recently his PSA is elevated again up to 29.64. His bone scan recently did not show any new disease.  Current therapy: Androgen depravation therapy in the form of Lupron under the care of Dr. Luberta Robertson. Xgeva given under the care of Dr. Luberta Robertson.  Xtandi 160 mg by mouth daily. Therapy beginning 12/16/2013  Interim History:  [name] [name] presents today for a followup visit. He is a nice man with the above history and developed castration resistant prostate cancer. He has metastatic disease to the bone but his CT scan chest abdomen and pelvis done on 11/23/2013 showed no visceral metastasis. He  Started [place] and has 4 days remaining of this first month of therapy. He has some confusion related to how/where to get his next month of Xtandi.he is tolerating the extent it relatively well. He does report some of life stressors related to some church issues. He seems to have come to a solution that is workable for him and now states that his stress level is diminishing.  He does not report any headaches or blurry vision or double vision. Does that report any back pain shoulder pain right hip pain. And despite his age, continued to be relatively in reasonable health and shape. His performance status remained stable.  Medications: I have reviewed the patient's current medications.  Current Outpatient Prescriptions   Medication Sig Dispense Refill  . aspirin 81 MG tablet Take 81 mg by mouth daily. Mon, wed, fri      . calcium gluconate 500 MG tablet Take 250 mg by mouth daily.      . Calcium-Vitamin D-Vitamin K [id number] MG-UNT-MCG CHEW Chew by mouth daily.      Marland Kitchen denosumab (XGEVA) 120 MG/1.7ML SOLN injection Inject into the skin.      . enzalutamide (XTANDI) 40 MG capsule Take 4 capsules (160 mg total) by mouth daily.  120 capsule  0  . fish oil-omega-3 fatty acids 1000 MG capsule Take 1,250 mg by mouth daily.      Marland Kitchen latanoprost (XALATAN) 0.005 % ophthalmic solution Place 1 drop into both eyes.       Marland Kitchen MISC NATURAL PRODUCTS PO Take by mouth daily. Activia      . Multiple Vitamin (DAILY VITAMIN FORMULA PO) Take by mouth daily. OTC Men's One a Day (10 key nutrients).      . prednisoLONE acetate (PRED FORTE) 1 % ophthalmic suspension Place 1 drop into both eyes Nightly.      . thiamine (VITAMIN B-1) 50 MG tablet Take 50 mg by mouth daily.      . Vitamin D, Ergocalciferol, (DRISDOL) 50000 UNITS CAPS Take 50,000 Units by mouth every 14 (fourteen) days.       No current facility-administered medications for this visit.     Allergies:  Allergies  Allergen Reactions  . Statins     Muscle weakness in arms and legs  Past Medical History, Surgical history, Social history, and Family History were reviewed and updated.  Review of Systems: Constitutional:  Negative for fever, chills, night sweats, anorexia, weight loss, pain. Cardiovascular: no chest pain or dyspnea on exertion Respiratory: no cough, shortness of breath, or wheezing Neurological: no TIA or stroke symptoms Dermatological: negative ENT: negative Skin: Negative. Gastrointestinal: no abdominal pain, change in bowel habits, or black or bloody stools Genito-Urinary: no dysuria, trouble voiding, or hematuria Hematological and Lymphatic: negative Breast: negative Musculoskeletal: negative Remaining ROS negative. Physical Exam: Blood  pressure 163/73, pulse 80, temperature 97.6 F (36.4 C), temperature source Oral, resp. rate 18, height 5' 9.5" (1.765 m), weight 153 lb 1.6 oz (69.446 kg). ECOG: 1 General appearance: alert and cooperative Head: Normocephalic, without obvious abnormality Neck: no adenopathy and thyroid not enlarged, symmetric, no tenderness/mass/nodules Lymph nodes: Cervical, supraclavicular, and axillary nodes normal. Heart:regular rate and rhythm, S1, S2 normal, no murmur, click, rub or gallop Lung:chest clear, no wheezing, rales, normal symmetric air entry Abdomin: soft, non-tender, without masses or organomegaly EXT:no erythema, induration, or nodules   Lab Results: Lab Results  Component Value Date   WBC 7.5 01/12/2014   HGB 14.1 01/12/2014   HCT 44.2 01/12/2014   MCV 93.2 01/12/2014   PLT 289 01/12/2014     Chemistry      Component Value Date/Time   NA 143 01/12/2014 1054   K 4.5 01/12/2014 1054   CO2 27 01/12/2014 1054   BUN 17.2 01/12/2014 1054   CREATININE 1.1 01/12/2014 1054      Component Value Date/Time   CALCIUM 10.3 01/12/2014 1054   ALKPHOS 89 01/12/2014 1054   AST 23 01/12/2014 1054   ALT 15 01/12/2014 1054   BILITOT 0.48 01/12/2014 1054       Radiological Studies:  EXAM:  CT CHEST, ABDOMEN, AND PELVIS WITH CONTRAST  TECHNIQUE:  Multidetector CT imaging of the chest, abdomen and pelvis was  performed following the standard protocol during bolus  administration of intravenous contrast.  CONTRAST: 186mL OMNIPAQUE IOHEXOL 300 MG/ML SOLN  COMPARISON: CT abdomen and pelvis 07/07/2012. Nuclear medicine  whole-body bone scan 10/18/2013.  FINDINGS:  CT CHEST FINDINGS  Pleural/parenchymal densities are noted in the apices bilaterally  compatible with scarring. Mild emphysematous changes. Linear  scarring at the right lung base in the right lower lobe. No  suspicious pulmonary nodules. No pleural effusions.  Heart is normal size. Diffuse dense coronary artery calcifications.  Aorta is normal  caliber.  Small and borderline sized mediastinal lymph nodes. Pretracheal  lymph node has a short axis diameter of 9 mm on image 27. No  adenopathy. Chest wall soft tissues are unremarkable.  Predominantly sclerotic area noted anteriorly within the L1  vertebral body. This is new since 2013 CT. While this area does not  demonstrate increased activity on recent bone scan, this is most  compatible with sclerotic metastasis. Other apparent small sclerotic  areas noted on axial imaging are demonstrated on sagittal imaging to  be associated with the endplates or disc spaces and are likely  degenerative.  CT ABDOMEN AND PELVIS FINDINGS  Liver, gallbladder, spleen, adrenals and kidneys are unremarkable.  Scattered calcifications throughout the pancreas could be vascular  or related to chronic pancreatitis. Stable postsurgical changes in  the region of the stomach. Moderate stool throughout the colon.  Scattered left colonic diverticulosis without active diverticulitis.  Small bowel is decompressed. There is a right inguinal hernia  containing a knuckle of small bowel, stable since prior  study. No  evidence of bowel obstruction. Small ventral hernia on image 80  contains fat, stable.  No free fluid, free air or adenopathy. Aorta and branch vessels are  heavily calcified. No aneurysm. Surgical clips in the region of the  prostate. Urinary bladder is grossly unremarkable.  Sclerotic lesion within the right iliac bone is concerning for bony  metastatic disease. This measures up to 6.7 cm on image 106.  IMPRESSION:  Sclerotic areas within the anterior L1 vertebral body and right  iliac bone compatible with sclerotic metastases.  Severe diffuse coronary artery calcifications.  Right inguinal hernia containing a small knuckle of small bowel,  stable. No evidence of bowel obstruction.    Impression and Plan:  80 year old gentleman with the following issues:  1. Castration resistant prostate  cancer with metastatic disease to the bone. The results of the CT scan was discussed and appears to have sclerotic lesion at L1 and right iliac bone. No visceral metastasis however. Patient now being treated with Xtandi 160 mg daily.  2. Androgen depravation: he will continue to receive that with Dr. Luberta Robertson.  3.Bone health: He will continue Xgeva the under the care of Dr. Luberta Robertson on a monthly basis.  4. Followup: He will be in 4-5 weeks to assess any toxicities related to [place].  Clarification was given to the patient as to how his next month's supply of [facility]erm[facility] Phy will get to him    Carlton Adam, PA-C  7/1/20152:42 PM

## 2014-01-13 LAB — PSA: PSA: 3.42 ng/mL (ref <=4.00)

## 2014-01-13 NOTE — Telephone Encounter (Signed)
RECEIVED A FAX FROM [facility] CONCERNING A CONFIRMATION OF PRESCRIPTION SHIPMENT FOR XTANDI ON 01/12/14.

## 2014-01-14 NOTE — Patient Instructions (Signed)
Continue Xtandi 160 mg by mouth daily Followup in approximately one month

## 2014-01-25 ENCOUNTER — Ambulatory Visit (INDEPENDENT_AMBULATORY_CARE_PROVIDER_SITE_OTHER): Payer: Medicare Other | Admitting: Urology

## 2014-01-25 DIAGNOSIS — C7951 Secondary malignant neoplasm of bone: Secondary | ICD-10-CM

## 2014-01-25 DIAGNOSIS — C61 Malignant neoplasm of prostate: Secondary | ICD-10-CM

## 2014-01-25 DIAGNOSIS — E291 Testicular hypofunction: Secondary | ICD-10-CM

## 2014-01-25 DIAGNOSIS — C7952 Secondary malignant neoplasm of bone marrow: Secondary | ICD-10-CM

## 2014-02-07 ENCOUNTER — Other Ambulatory Visit: Payer: Self-pay | Admitting: *Deleted

## 2014-02-07 NOTE — Telephone Encounter (Signed)
THIS REFILL REQUEST FOR XTANDI WAS PLACED IN DR.SHADAD'S ACTIVE WORK FOLDER. 

## 2014-02-08 ENCOUNTER — Other Ambulatory Visit: Payer: Self-pay | Admitting: Medical Oncology

## 2014-02-08 MED ORDER — ENZALUTAMIDE 40 MG PO CAPS: 160.0000 mg | ORAL_CAPSULE | Freq: Every day | ORAL | Status: DC

## 2014-02-08 NOTE — Telephone Encounter (Signed)
Prescription refill for [facility] faxed to [facility] @ [phone]

## 2014-02-09 ENCOUNTER — Ambulatory Visit (HOSPITAL_BASED_OUTPATIENT_CLINIC_OR_DEPARTMENT_OTHER): Payer: Medicare Other | Admitting: Oncology

## 2014-02-09 ENCOUNTER — Telehealth: Payer: Self-pay | Admitting: Oncology

## 2014-02-09 ENCOUNTER — Encounter: Payer: Self-pay | Admitting: Oncology

## 2014-02-09 ENCOUNTER — Other Ambulatory Visit (HOSPITAL_BASED_OUTPATIENT_CLINIC_OR_DEPARTMENT_OTHER): Payer: Medicare Other

## 2014-02-09 VITALS — BP 142/61 | HR 77 | Temp 98.2°F | Resp 20 | Ht 69.5 in | Wt 153.3 lb

## 2014-02-09 DIAGNOSIS — C7951 Secondary malignant neoplasm of bone: Secondary | ICD-10-CM

## 2014-02-09 DIAGNOSIS — C7952 Secondary malignant neoplasm of bone marrow: Secondary | ICD-10-CM

## 2014-02-09 DIAGNOSIS — E291 Testicular hypofunction: Secondary | ICD-10-CM

## 2014-02-09 DIAGNOSIS — C61 Malignant neoplasm of prostate: Secondary | ICD-10-CM

## 2014-02-09 LAB — CBC WITH DIFFERENTIAL/PLATELET
BASO%: 1 % (ref 0.0–2.0)
Basophils Absolute: 0.1 10*3/uL (ref 0.0–0.1)
EOS%: 3.9 % (ref 0.0–7.0)
Eosinophils Absolute: 0.2 10*3/uL (ref 0.0–0.5)
HCT: 40.6 % (ref 38.4–49.9)
HGB: 13 g/dL (ref 13.0–17.1)
LYMPH%: 12.3 % — AB (ref 14.0–49.0)
MCH: 29.6 pg (ref 27.2–33.4)
MCHC: 32.1 g/dL (ref 32.0–36.0)
MCV: 92 fL (ref 79.3–98.0)
MONO#: 0.8 10*3/uL (ref 0.1–0.9)
MONO%: 14.5 % — AB (ref 0.0–14.0)
NEUT%: 68.3 % (ref 39.0–75.0)
NEUTROS ABS: 3.7 10*3/uL (ref 1.5–6.5)
PLATELETS: 278 10*3/uL (ref 140–400)
RBC: 4.41 10*6/uL (ref 4.20–5.82)
RDW: 13.4 % (ref 11.0–14.6)
WBC: 5.4 10*3/uL (ref 4.0–10.3)
lymph#: 0.7 10*3/uL — ABNORMAL LOW (ref 0.9–3.3)

## 2014-02-09 LAB — COMPREHENSIVE METABOLIC PANEL (CC13)
ALK PHOS: 86 U/L (ref 40–150)
ALT: 21 U/L (ref 0–55)
AST: 29 U/L (ref 5–34)
Albumin: 3.3 g/dL — ABNORMAL LOW (ref 3.5–5.0)
Anion Gap: 9 mEq/L (ref 3–11)
BILIRUBIN TOTAL: 0.49 mg/dL (ref 0.20–1.20)
BUN: 16 mg/dL (ref 7.0–26.0)
CO2: 27 mEq/L (ref 22–29)
Calcium: 9.8 mg/dL (ref 8.4–10.4)
Chloride: 104 mEq/L (ref 98–109)
Creatinine: 1.1 mg/dL (ref 0.7–1.3)
GLUCOSE: 130 mg/dL (ref 70–140)
Potassium: 5 mEq/L (ref 3.5–5.1)
SODIUM: 140 meq/L (ref 136–145)
TOTAL PROTEIN: 6.9 g/dL (ref 6.4–8.3)

## 2014-02-09 NOTE — Telephone Encounter (Signed)
Pt confirmed labs/ov per 07/29 POF, gave pt AVS...KJ °

## 2014-02-09 NOTE — Progress Notes (Signed)
Hematology and Oncology Follow Up Visit  [name] [name] [id number] 03-17-1926 80 y.o. 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 His initial diagnosis was in 2010 with a Gleason score 4+3 equals 7 and a PSA of 9.43.   Prior Therapy:  He was treated with definitive radiation therapy after a brief period of hormone treatments with a PSA nadir of 0.08 for 2 years.  He subsequently developed bony metastasis and 2014 and a PSA up to 39. He was treated with androgen depravation and a PSA nadir to up to 5.23 and most recently his PSA is elevated again up to 29.64. His bone scan recently did not show any new disease.  Current therapy: Androgen depravation therapy in the form of Lupron under the care of Dr. Luberta Robertson. Xgeva given under the care of Dr. Luberta Robertson. Xtandi 160 mg by mouth daily. Therapy beginning 12/16/2013  Interim History:  [name] [name] presents today for a followup visit. Since his last visit, he is doing well without any new complaints. He continues to be on  Xtandi without any complications. He is tolerating it relatively well. He does report some of life stressors related to some church issues which have resolved at this time.  He does not report any headaches or blurry vision or double vision. Does that report any back pain shoulder pain right hip pain. And despite his age, continued to be relatively in reasonable health and shape. His performance status remained stable. He does not report any fevers or chills or sweats. As that report any chest pain or palpitation. He does not report any nausea or vomiting or abdominal pain. Does not report any frequency urgency or hesitancy. Rest of his review of systems unremarkable.  Medications: I have reviewed the patient's current medications.  Current Outpatient Prescriptions  Medication Sig Dispense Refill  . aspirin 81 MG tablet Take 81 mg by mouth daily.  Mon, wed, fri      . azithromycin (ZITHROMAX) 250 MG tablet       . calcium gluconate 500 MG tablet Take 250 mg by mouth daily.      . Calcium-Vitamin D-Vitamin K [id number] MG-UNT-MCG CHEW Chew by mouth daily.      . cetirizine (ZYRTEC) 1 MG/ML syrup       . denosumab (XGEVA) 120 MG/1.7ML SOLN injection Inject into the skin.      . enzalutamide (XTANDI) 40 MG capsule Take 4 capsules (160 mg total) by mouth daily.  120 capsule  0  . fish oil-omega-3 fatty acids 1000 MG capsule Take 1,250 mg by mouth daily.      Marland Kitchen latanoprost (XALATAN) 0.005 % ophthalmic solution Place 1 drop into both eyes.       Marland Kitchen MISC NATURAL PRODUCTS PO Take by mouth daily. Activia      . Multiple Vitamin (DAILY VITAMIN FORMULA PO) Take by mouth daily. OTC Men's One a Day (10 key nutrients).      . prednisoLONE acetate (PRED FORTE) 1 % ophthalmic suspension Place 1 drop into both eyes Nightly.      . thiamine (VITAMIN B-1) 50 MG tablet Take 50 mg by mouth daily.      . Vitamin D, Ergocalciferol, (DRISDOL) 50000 UNITS CAPS Take 50,000 Units by mouth every 14 (fourteen) days.       No current facility-administered medications for this visit.     Allergies:  Allergies  Allergen Reactions  . Statins  Muscle weakness in arms and legs    Past Medical History, Surgical history, Social history, and Family History were reviewed and updated.  Physical Exam: Blood pressure 142/61, pulse 77, temperature 98.2 F (36.8 C), temperature source Oral, resp. rate 20, height 5' 9.5" (1.765 m), weight 153 lb 4.8 oz (69.536 kg). ECOG: 1 General appearance: alert and cooperative Head: Normocephalic, without obvious abnormality Neck: no adenopathy and thyroid not enlarged, symmetric, no tenderness/mass/nodules Lymph nodes: Cervical, supraclavicular, and axillary nodes normal. Heart:regular rate and rhythm, S1, S2 normal, no murmur, click, rub or gallop Lung:chest clear, no wheezing, rales, normal symmetric air entry Abdomin:  soft, non-tender, without masses or organomegaly EXT:no erythema, induration, or nodules Neurologically intact.  Lab Results: Lab Results  Component Value Date   WBC 5.4 02/09/2014   HGB 13.0 02/09/2014   HCT 40.6 02/09/2014   MCV 92.0 02/09/2014   PLT 278 02/09/2014     Chemistry      Component Value Date/Time   NA 143 01/12/2014 1054   K 4.5 01/12/2014 1054   CO2 27 01/12/2014 1054   BUN 17.2 01/12/2014 1054   CREATININE 1.1 01/12/2014 1054      Component Value Date/Time   CALCIUM 10.3 01/12/2014 1054   ALKPHOS 89 01/12/2014 1054   AST 23 01/12/2014 1054   ALT 15 01/12/2014 1054   BILITOT 0.48 01/12/2014 1054       Results for [name] [name] (MRN [id number]) as of 02/09/2014 09:48  Ref. Range 12/15/2013 14:47 01/12/2014 10:54  PSA Latest Range: <=4.00 ng/mL 50.48 (H) 3.42    Impression and Plan:  80 year old gentleman with the following issues:  1. Castration resistant prostate cancer with metastatic disease to the bone. He is now being treated with Xtandi 160 mg daily and have tolerated it well. His PSA dropped dramatically from 50.48-3.42. I plan to continue with the same dose and schedule without any changes.  2. Androgen depravation: he will continue to receive that with Dr. Luberta Robertson.  3.Bone health: He will continue Xgeva the under the care of Dr. Luberta Robertson on a monthly basis.  4. Followup: He will be in 4-5 weeks to assess any toxicities related to [place].      [facility], MD 7/29/20159:45 AM

## 2014-02-10 LAB — PSA: PSA: 0.83 ng/mL (ref <=4.00)

## 2014-03-01 ENCOUNTER — Ambulatory Visit (INDEPENDENT_AMBULATORY_CARE_PROVIDER_SITE_OTHER): Payer: Medicare Other | Admitting: Urology

## 2014-03-01 DIAGNOSIS — C61 Malignant neoplasm of prostate: Secondary | ICD-10-CM | POA: Diagnosis not present

## 2014-03-01 DIAGNOSIS — C7951 Secondary malignant neoplasm of bone: Secondary | ICD-10-CM

## 2014-03-01 DIAGNOSIS — C7952 Secondary malignant neoplasm of bone marrow: Secondary | ICD-10-CM | POA: Diagnosis not present

## 2014-03-14 ENCOUNTER — Other Ambulatory Visit: Payer: Self-pay | Admitting: *Deleted

## 2014-03-14 DIAGNOSIS — C61 Malignant neoplasm of prostate: Secondary | ICD-10-CM

## 2014-03-14 NOTE — Telephone Encounter (Signed)
THIS REFILL REQUEST FOR XTANDI WAS PLACED IN DR.SHADAD'S ACTIVE WORK FOLDER. 

## 2014-03-15 MED ORDER — ENZALUTAMIDE 40 MG PO CAPS: 160.0000 mg | ORAL_CAPSULE | Freq: Every day | ORAL | Status: DC

## 2014-03-15 NOTE — Telephone Encounter (Signed)
RECEIVED A FAX FROM [facility] CONCERNING A CONFIRMATION OF PRESCRIPTION SHIPMENT FOR XTANDI ON 03/14/14.

## 2014-03-15 NOTE — Addendum Note (Signed)
Addended by: Wyonia Hough on: 03/15/2014 02:22 PM   Modules accepted: Orders

## 2014-03-17 ENCOUNTER — Ambulatory Visit (HOSPITAL_BASED_OUTPATIENT_CLINIC_OR_DEPARTMENT_OTHER): Payer: Medicare Other | Admitting: Physician Assistant

## 2014-03-17 ENCOUNTER — Other Ambulatory Visit (HOSPITAL_BASED_OUTPATIENT_CLINIC_OR_DEPARTMENT_OTHER): Payer: Medicare Other

## 2014-03-17 ENCOUNTER — Telehealth: Payer: Self-pay | Admitting: Oncology

## 2014-03-17 ENCOUNTER — Encounter: Payer: Self-pay | Admitting: Physician Assistant

## 2014-03-17 VITALS — BP 138/57 | HR 89 | Temp 98.0°F | Resp 17 | Ht 69.0 in | Wt 150.0 lb

## 2014-03-17 DIAGNOSIS — C7952 Secondary malignant neoplasm of bone marrow: Secondary | ICD-10-CM

## 2014-03-17 DIAGNOSIS — C7951 Secondary malignant neoplasm of bone: Secondary | ICD-10-CM

## 2014-03-17 DIAGNOSIS — C61 Malignant neoplasm of prostate: Secondary | ICD-10-CM

## 2014-03-17 DIAGNOSIS — E291 Testicular hypofunction: Secondary | ICD-10-CM

## 2014-03-17 LAB — CBC WITH DIFFERENTIAL/PLATELET
BASO%: 0.7 % (ref 0.0–2.0)
Basophils Absolute: 0 10*3/uL (ref 0.0–0.1)
EOS%: 3 % (ref 0.0–7.0)
Eosinophils Absolute: 0.2 10*3/uL (ref 0.0–0.5)
HEMATOCRIT: 42.1 % (ref 38.4–49.9)
HGB: 13.8 g/dL (ref 13.0–17.1)
LYMPH%: 10.9 % — ABNORMAL LOW (ref 14.0–49.0)
MCH: 30.2 pg (ref 27.2–33.4)
MCHC: 32.8 g/dL (ref 32.0–36.0)
MCV: 92.1 fL (ref 79.3–98.0)
MONO#: 0.7 10*3/uL (ref 0.1–0.9)
MONO%: 12.5 % (ref 0.0–14.0)
NEUT#: 4.2 10*3/uL (ref 1.5–6.5)
NEUT%: 72.9 % (ref 39.0–75.0)
PLATELETS: 261 10*3/uL (ref 140–400)
RBC: 4.57 10*6/uL (ref 4.20–5.82)
RDW: 13.3 % (ref 11.0–14.6)
WBC: 5.8 10*3/uL (ref 4.0–10.3)
lymph#: 0.6 10*3/uL — ABNORMAL LOW (ref 0.9–3.3)

## 2014-03-17 LAB — COMPREHENSIVE METABOLIC PANEL (CC13)
ALT: 13 U/L (ref 0–55)
AST: 21 U/L (ref 5–34)
Albumin: 3.4 g/dL — ABNORMAL LOW (ref 3.5–5.0)
Alkaline Phosphatase: 90 U/L (ref 40–150)
Anion Gap: 9 mEq/L (ref 3–11)
BUN: 18.6 mg/dL (ref 7.0–26.0)
CO2: 28 meq/L (ref 22–29)
Calcium: 9.5 mg/dL (ref 8.4–10.4)
Chloride: 105 mEq/L (ref 98–109)
Creatinine: 1.1 mg/dL (ref 0.7–1.3)
Glucose: 112 mg/dl (ref 70–140)
Potassium: 4.5 mEq/L (ref 3.5–5.1)
Sodium: 142 mEq/L (ref 136–145)
Total Bilirubin: 0.42 mg/dL (ref 0.20–1.20)
Total Protein: 7.5 g/dL (ref 6.4–8.3)

## 2014-03-17 NOTE — Telephone Encounter (Signed)
gv and printed appt sched and avs for pt for OCT. °

## 2014-03-17 NOTE — Progress Notes (Signed)
Hematology and Oncology Follow Up Visit  [name] [name] [id number] 1925/08/19 80 y.o. 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 His initial diagnosis was in 2010 with a Gleason score 4+3 equals 7 and a PSA of 9.43.   Prior Therapy:  He was treated with definitive radiation therapy after a brief period of hormone treatments with a PSA nadir of 0.08 for 2 years.  He subsequently developed bony metastasis and 2014 and a PSA up to 39. He was treated with androgen depravation and a PSA nadir to up to 5.23 and most recently his PSA is elevated again up to 29.64. His bone scan recently did not show any new disease.  Current therapy: Androgen depravation therapy in the form of Lupron under the care of Dr. Luberta Robertson. Xgeva given under the care of Dr. Luberta Robertson. Xtandi 160 mg by mouth daily. Therapy beginning 12/16/2013  Interim History:  [name] [name] presents today for a followup visit. Since his last visit, he is doing well without any new complaints. He continues to be on  Xtandi without any complications. He we ports decreased life stressors. He does not report any headaches or blurry vision or double vision. Does that report any back pain shoulder pain right hip pain. And despite his age, continued to be relatively in reasonable health and shape. His performance status remained stable. He does not report any fevers or chills or sweats. Does not report any chest pain or palpitation. He does not report any nausea or vomiting or abdominal pain. Does not report any frequency urgency or hesitancy. Remainder of his review of systems unremarkable.  Medications: I have reviewed the patient's current medications.  Current Outpatient Prescriptions  Medication Sig Dispense Refill  . aspirin 81 MG tablet Take 81 mg by mouth daily. Mon, wed, fri      . Calcium-Vitamin D-Vitamin K [id number] MG-UNT-MCG CHEW Chew by mouth daily.       Marland Kitchen denosumab (XGEVA) 120 MG/1.7ML SOLN injection Inject into the skin.      . enzalutamide (XTANDI) 40 MG capsule Take 4 capsules (160 mg total) by mouth daily.  120 capsule  0  . fish oil-omega-3 fatty acids 1000 MG capsule Take 1,250 mg by mouth daily.      Marland Kitchen latanoprost (XALATAN) 0.005 % ophthalmic solution Place 1 drop into both eyes.       Marland Kitchen MISC NATURAL PRODUCTS PO Take by mouth daily. Activia      . Multiple Vitamin (DAILY VITAMIN FORMULA PO) Take by mouth daily. OTC Men's One a [name] (10 key nutrients).      . prednisoLONE acetate (PRED FORTE) 1 % ophthalmic suspension Place 1 drop into both eyes Nightly.      . thiamine (VITAMIN B-1) 50 MG tablet Take 50 mg by mouth daily.      . Vitamin D, Ergocalciferol, (DRISDOL) 50000 UNITS CAPS Take 50,000 Units by mouth every 14 (fourteen) days.      . calcium gluconate 500 MG tablet Take 250 mg by mouth daily.       No current facility-administered medications for this visit.     Allergies:  Allergies  Allergen Reactions  . Statins     Muscle weakness in arms and legs    Past Medical History, Surgical history, Social history, and Family History were reviewed and updated.  Physical Exam: Blood pressure 138/57, pulse 89, temperature 98 F (36.7 C), temperature source Oral, resp. rate 17, height  5\' 9"  (1.753 m), weight 150 lb (68.04 kg), SpO2 100.00%. ECOG: 1 General appearance: alert and cooperative Head: Normocephalic, without obvious abnormality Neck: no adenopathy and thyroid not enlarged, symmetric, no tenderness/mass/nodules Lymph nodes: Cervical, supraclavicular, and axillary nodes normal. Heart:regular rate and rhythm, S1, S2 normal, no murmur, click, rub or gallop Lung:chest clear, no wheezing, rales, normal symmetric air entry Abdomin: soft, non-tender, without masses or organomegaly EXT:no erythema, induration, or nodules Neurologically intact.  Lab Results: Lab Results  Component Value Date   WBC 5.8 03/17/2014   HGB  13.8 03/17/2014   HCT 42.1 03/17/2014   MCV 92.1 03/17/2014   PLT 261 03/17/2014     Chemistry      Component Value Date/Time   NA 142 03/17/2014 0850   K 4.5 03/17/2014 0850   CO2 28 03/17/2014 0850   BUN 18.6 03/17/2014 0850   CREATININE 1.1 03/17/2014 0850      Component Value Date/Time   CALCIUM 9.5 03/17/2014 0850   ALKPHOS 90 03/17/2014 0850   AST 21 03/17/2014 0850   ALT 13 03/17/2014 0850   BILITOT 0.42 03/17/2014 0850       Results for [name] [name] (MRN [id number]) as of 02/09/2014 09:48  Ref. Range 12/15/2013 14:47 01/12/2014 10:54  PSA Latest Range: <=4.00 ng/mL 50.48 (H) 3.42    Impression and Plan:  80 year old gentleman with the following issues:  1. Castration resistant prostate cancer with metastatic disease to the bone. He is now being treated with Xtandi 160 mg daily and have tolerated it well. His PSA dropped dramatically from 50.48-3.42. The PSA on 01/31/1914 with 0.83. PSA from today is pending . He is to continue with the same dose and schedule without any changes.  2. Androgen depravation: he will continue to receive that with Dr. Luberta Robertson.  3.Bone health: He will continue Xgeva the under the care of Dr. Luberta Robertson on a monthly basis.  4. Followup: He will be in 4-5 weeks to assess any toxicities related to [place].  Malignant neoplasm of prostate Patient is a pleasant 80 year old Caucasian male with castration resistant metastatic prostate cancer. Currently being treated with extended 160 mg by mouth daily. He is tolerating this treatment without difficulty. He receives Lupron for androgen deprivation and Xgeva bone directed therapy under the care of Dr. Luberta Robertson. He'll return in one month for another symptom management visit.     Carlton Adam, PA-C  9/3/20154:59 PM

## 2014-03-18 LAB — PSA: PSA: 0.51 ng/mL (ref <=4.00)

## 2014-03-18 NOTE — Patient Instructions (Signed)
Continue Xtandi at the current dose Follow up in one month 

## 2014-03-18 NOTE — Assessment & Plan Note (Signed)
Patient is a pleasant 80 year old Caucasian male with castration resistant metastatic prostate cancer. Currently being treated with extended 160 mg by mouth daily. He is tolerating this treatment without difficulty. He receives Lupron for androgen deprivation and Xgeva bone directed therapy under the care of Dr. Luberta Robertson. He'll return in one month for another symptom management visit.

## 2014-04-01 ENCOUNTER — Ambulatory Visit (INDEPENDENT_AMBULATORY_CARE_PROVIDER_SITE_OTHER): Payer: Medicare Other | Admitting: Urology

## 2014-04-01 DIAGNOSIS — C7952 Secondary malignant neoplasm of bone marrow: Secondary | ICD-10-CM

## 2014-04-01 DIAGNOSIS — C7951 Secondary malignant neoplasm of bone: Secondary | ICD-10-CM

## 2014-04-01 DIAGNOSIS — C61 Malignant neoplasm of prostate: Secondary | ICD-10-CM

## 2014-04-08 ENCOUNTER — Telehealth: Payer: Self-pay | Admitting: *Deleted

## 2014-04-08 NOTE — Telephone Encounter (Addendum)
[facility] faxed Xtandi refill request.  Request to provider's desk/in-basket for review.  Also notified [facility].

## 2014-04-11 ENCOUNTER — Other Ambulatory Visit: Payer: Self-pay | Admitting: *Deleted

## 2014-04-11 DIAGNOSIS — C61 Malignant neoplasm of prostate: Secondary | ICD-10-CM

## 2014-04-11 MED ORDER — ENZALUTAMIDE 40 MG PO CAPS: 160.0000 mg | ORAL_CAPSULE | Freq: Every day | ORAL | Status: DC

## 2014-04-15 NOTE — Telephone Encounter (Signed)
RECEIVED A FAX FROM [facility] CONCERNING A CONFIRMATION OF PRESCRIPTION SHIPMENT FOR XTANDI ON 04/14/14.

## 2014-04-27 ENCOUNTER — Telehealth: Payer: Self-pay | Admitting: Oncology

## 2014-04-27 ENCOUNTER — Other Ambulatory Visit (HOSPITAL_BASED_OUTPATIENT_CLINIC_OR_DEPARTMENT_OTHER): Payer: Medicare Other

## 2014-04-27 ENCOUNTER — Ambulatory Visit (HOSPITAL_BASED_OUTPATIENT_CLINIC_OR_DEPARTMENT_OTHER): Payer: Medicare Other | Admitting: Oncology

## 2014-04-27 ENCOUNTER — Encounter: Payer: Self-pay | Admitting: Oncology

## 2014-04-27 VITALS — BP 150/61 | HR 88 | Temp 97.9°F | Resp 18 | Ht 69.0 in | Wt 148.3 lb

## 2014-04-27 DIAGNOSIS — C61 Malignant neoplasm of prostate: Secondary | ICD-10-CM

## 2014-04-27 DIAGNOSIS — C7951 Secondary malignant neoplasm of bone: Secondary | ICD-10-CM

## 2014-04-27 LAB — CBC WITH DIFFERENTIAL/PLATELET
BASO%: 0.9 % (ref 0.0–2.0)
BASOS ABS: 0 10*3/uL (ref 0.0–0.1)
EOS ABS: 0.2 10*3/uL (ref 0.0–0.5)
EOS%: 3.6 % (ref 0.0–7.0)
HEMATOCRIT: 40.5 % (ref 38.4–49.9)
HEMOGLOBIN: 13 g/dL (ref 13.0–17.1)
LYMPH#: 0.7 10*3/uL — AB (ref 0.9–3.3)
LYMPH%: 12.5 % — ABNORMAL LOW (ref 14.0–49.0)
MCH: 29.6 pg (ref 27.2–33.4)
MCHC: 32.1 g/dL (ref 32.0–36.0)
MCV: 92.2 fL (ref 79.3–98.0)
MONO#: 0.6 10*3/uL (ref 0.1–0.9)
MONO%: 11.3 % (ref 0.0–14.0)
NEUT%: 71.7 % (ref 39.0–75.0)
NEUTROS ABS: 3.9 10*3/uL (ref 1.5–6.5)
Platelets: 264 10*3/uL (ref 140–400)
RBC: 4.39 10*6/uL (ref 4.20–5.82)
RDW: 12.9 % (ref 11.0–14.6)
WBC: 5.4 10*3/uL (ref 4.0–10.3)

## 2014-04-27 LAB — COMPREHENSIVE METABOLIC PANEL (CC13)
ALBUMIN: 3.2 g/dL — AB (ref 3.5–5.0)
ALT: 12 U/L (ref 0–55)
ANION GAP: 8 meq/L (ref 3–11)
AST: 16 U/L (ref 5–34)
Alkaline Phosphatase: 82 U/L (ref 40–150)
BUN: 15.6 mg/dL (ref 7.0–26.0)
CO2: 27 mEq/L (ref 22–29)
CREATININE: 1.1 mg/dL (ref 0.7–1.3)
Calcium: 10.1 mg/dL (ref 8.4–10.4)
Chloride: 106 mEq/L (ref 98–109)
Glucose: 111 mg/dl (ref 70–140)
Potassium: 4.9 mEq/L (ref 3.5–5.1)
Sodium: 141 mEq/L (ref 136–145)
Total Bilirubin: 0.51 mg/dL (ref 0.20–1.20)
Total Protein: 7.1 g/dL (ref 6.4–8.3)

## 2014-04-27 NOTE — Telephone Encounter (Signed)
gv and printed appt sched and avs for pt for NOV °

## 2014-04-27 NOTE — Progress Notes (Signed)
Hematology and Oncology Follow Up Visit  [name] [name] [id number] 1925-10-02 80 y.o. 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 His initial diagnosis was in 2010 with a Gleason score 4+3 equals 7 and a PSA of 9.43.   Prior Therapy:  He was treated with definitive radiation therapy after a brief period of hormone treatments with a PSA nadir of 0.08 for 2 years.  He subsequently developed bony metastasis and 2014 and a PSA up to 39. He was treated with androgen depravation and a PSA nadir to up to 5.23 and most recently his PSA is elevated again up to 29.64. His bone scan recently did not show any new disease.  Current therapy: Androgen depravation therapy in the form of Lupron under the care of Dr. Luberta Robertson. Xgeva given under the care of Dr. Luberta Robertson. Xtandi 160 mg by mouth daily. Therapy beginning 12/16/2013  Interim History:  [name] [name] presents today for a followup visit. Since his last visit, he has not reported any new complications. He continues to be on  Xtandi. He is tolerating it relatively well except for mild fatigue. He also has noted some slight increase in his blood pressure.  He does not report any headaches or blurry vision or double vision. Does that report any back pain shoulder pain right hip pain. And despite his age, continued to be relatively in reasonable health and shape. His performance status remained stable as he continues to drive and it tends to daily activities. He does not report any fevers or chills or sweats. As that report any chest pain or palpitation. He does not report any nausea or vomiting or abdominal pain. Does not report any frequency urgency or hesitancy. Rest of his review of systems unremarkable.  Medications: I have reviewed the patient's current medications.  Current Outpatient Prescriptions  Medication Sig Dispense Refill  . Artificial Tear Solution (SOOTHE  HYDRATION) 1.25 % SOLN Apply to eye.      Marland Kitchen aspirin 81 MG tablet Take 81 mg by mouth daily. Mon, wed, fri      . calcium gluconate 500 MG tablet Take 250 mg by mouth daily.      . Calcium-Vitamin D-Vitamin K [id number] MG-UNT-MCG CHEW Chew by mouth daily.      Marland Kitchen denosumab (XGEVA) 120 MG/1.7ML SOLN injection Inject into the skin.      . enzalutamide (XTANDI) 40 MG capsule Take 4 capsules (160 mg total) by mouth daily.  120 capsule  0  . fish oil-omega-3 fatty acids 1000 MG capsule Take 1,250 mg by mouth daily.      Marland Kitchen latanoprost (XALATAN) 0.005 % ophthalmic solution Place 1 drop into both eyes.       Marland Kitchen MISC NATURAL PRODUCTS PO Take by mouth daily. Activia      . Multiple Vitamin (DAILY VITAMIN FORMULA PO) Take by mouth daily. OTC Men's One a Day (10 key nutrients).      . Multiple Vitamins-Minerals (ONE-A-DAY MENS 50+ ADVANTAGE) TABS Take by mouth.      . prednisoLONE acetate (PRED FORTE) 1 % ophthalmic suspension Place 1 drop into both eyes Nightly.      . thiamine (VITAMIN B-1) 50 MG tablet Take 50 mg by mouth daily.      . Vitamin D, Ergocalciferol, (DRISDOL) 50000 UNITS CAPS Take 50,000 Units by mouth every 14 (fourteen) days.       No current facility-administered medications for this visit.  Allergies:  Allergies  Allergen Reactions  . Statins     Muscle weakness in arms and legs    Past Medical History, Surgical history, Social history, and Family History were reviewed and updated.  Physical Exam: Blood pressure 150/61, pulse 88, temperature 97.9 F (36.6 C), temperature source Oral, resp. rate 18, height 5\' 9"  (1.753 m), weight 148 lb 4.8 oz (67.268 kg), SpO2 98.00%. ECOG: 1 General appearance: alert and cooperative Head: Normocephalic, without obvious abnormality Neck: no adenopathy Lymph nodes: Cervical, supraclavicular, and axillary nodes normal. Heart:regular rate and rhythm, S1, S2 normal, no murmur, click, rub or gallop Lung:chest clear, no wheezing, rales, normal  symmetric air entry Abdomin: soft, non-tender, without masses or organomegaly EXT:no erythema, induration, or nodules Neurologically intact without deficits.   Lab Results: Lab Results  Component Value Date   WBC 5.4 04/27/2014   HGB 13.0 04/27/2014   HCT 40.5 04/27/2014   MCV 92.2 04/27/2014   PLT 264 04/27/2014     Chemistry      Component Value Date/Time   NA 142 03/17/2014 0850   K 4.5 03/17/2014 0850   CO2 28 03/17/2014 0850   BUN 18.6 03/17/2014 0850   CREATININE 1.1 03/17/2014 0850      Component Value Date/Time   CALCIUM 9.5 03/17/2014 0850   ALKPHOS 90 03/17/2014 0850   AST 21 03/17/2014 0850   ALT 13 03/17/2014 0850   BILITOT 0.42 03/17/2014 0850       Results for [name] [name] (MRN [id number]) as of 04/27/2014 09:09  Ref. Range 12/15/2013 14:47 01/12/2014 10:54 02/09/2014 09:18 03/17/2014 08:50  PSA Latest Range: <=4.00 ng/mL 50.48 (H) 3.42 0.83 0.51     Impression and Plan:  80 year old gentleman with the following issues:  1. Castration resistant prostate cancer with metastatic disease to the bone. He is now being treated with Xtandi 160 mg daily and have tolerated it well. His PSA dropped dramatically from 50.48 and now it's down to 0.51. I plan to continue with the same dose and schedule without any changes.  2. Androgen depravation: he will continue to receive that with Dr. Luberta Robertson.  3.Bone health: He will continue Xgeva the under the care of Dr. Luberta Robertson on a monthly basis.  4. Followup: He will be in 4-5 weeks to assess any toxicities related to [place].      [facility], MD 10/14/20159:06 AM

## 2014-04-28 LAB — PSA: PSA: 2.01 ng/mL (ref <=4.00)

## 2014-05-03 ENCOUNTER — Ambulatory Visit (INDEPENDENT_AMBULATORY_CARE_PROVIDER_SITE_OTHER): Payer: Medicare Other | Admitting: Urology

## 2014-05-03 DIAGNOSIS — C61 Malignant neoplasm of prostate: Secondary | ICD-10-CM

## 2014-05-03 DIAGNOSIS — C7951 Secondary malignant neoplasm of bone: Secondary | ICD-10-CM

## 2014-05-09 ENCOUNTER — Other Ambulatory Visit: Payer: Self-pay | Admitting: *Deleted

## 2014-05-09 DIAGNOSIS — C61 Malignant neoplasm of prostate: Secondary | ICD-10-CM

## 2014-05-09 NOTE — Telephone Encounter (Signed)
THIS REFILL REQUEST FOR XTANDI WAS PLACED IN DR.SHADAD'S ACTIVE WORK FOLDER. 

## 2014-05-10 MED ORDER — ENZALUTAMIDE 40 MG PO CAPS: 160.0000 mg | ORAL_CAPSULE | Freq: Every day | ORAL | Status: DC

## 2014-05-10 NOTE — Telephone Encounter (Signed)
RECEIVED A FAX FROM [facility] CONCERNING A CONFIRMATION OF FACSIMILE RECEIPT FOR PT. REFERRAL. 

## 2014-05-10 NOTE — Addendum Note (Signed)
Addended by: Wyonia Hough on: 05/10/2014 08:41 AM   Modules accepted: Orders

## 2014-05-13 NOTE — Telephone Encounter (Signed)
RECEIVED A FAX FROM [facility] CONCERNING A CONFIRMATION OF PRESCRIPTION SHIPMENT FOR XTANDI ON 05/12/14.

## 2014-05-25 ENCOUNTER — Other Ambulatory Visit (HOSPITAL_BASED_OUTPATIENT_CLINIC_OR_DEPARTMENT_OTHER): Payer: Medicare Other

## 2014-05-25 ENCOUNTER — Ambulatory Visit (HOSPITAL_BASED_OUTPATIENT_CLINIC_OR_DEPARTMENT_OTHER): Payer: Medicare Other | Admitting: Oncology

## 2014-05-25 ENCOUNTER — Telehealth: Payer: Self-pay | Admitting: Oncology

## 2014-05-25 VITALS — BP 139/67 | HR 82 | Temp 98.6°F | Resp 18 | Ht 69.0 in | Wt 150.4 lb

## 2014-05-25 DIAGNOSIS — C7951 Secondary malignant neoplasm of bone: Secondary | ICD-10-CM

## 2014-05-25 DIAGNOSIS — C61 Malignant neoplasm of prostate: Secondary | ICD-10-CM

## 2014-05-25 DIAGNOSIS — E291 Testicular hypofunction: Secondary | ICD-10-CM

## 2014-05-25 LAB — CBC WITH DIFFERENTIAL/PLATELET
BASO%: 0.7 % (ref 0.0–2.0)
Basophils Absolute: 0 10*3/uL (ref 0.0–0.1)
EOS ABS: 0.1 10*3/uL (ref 0.0–0.5)
EOS%: 2.4 % (ref 0.0–7.0)
HCT: 39.9 % (ref 38.4–49.9)
HGB: 13 g/dL (ref 13.0–17.1)
LYMPH#: 0.7 10*3/uL — AB (ref 0.9–3.3)
LYMPH%: 12.3 % — AB (ref 14.0–49.0)
MCH: 29.6 pg (ref 27.2–33.4)
MCHC: 32.6 g/dL (ref 32.0–36.0)
MCV: 90.9 fL (ref 79.3–98.0)
MONO#: 0.5 10*3/uL (ref 0.1–0.9)
MONO%: 9.4 % (ref 0.0–14.0)
NEUT%: 75.2 % — ABNORMAL HIGH (ref 39.0–75.0)
NEUTROS ABS: 4.3 10*3/uL (ref 1.5–6.5)
Platelets: 236 10*3/uL (ref 140–400)
RBC: 4.39 10*6/uL (ref 4.20–5.82)
RDW: 13.3 % (ref 11.0–14.6)
WBC: 5.8 10*3/uL (ref 4.0–10.3)

## 2014-05-25 LAB — COMPREHENSIVE METABOLIC PANEL (CC13)
ALBUMIN: 3.3 g/dL — AB (ref 3.5–5.0)
ALT: 10 U/L (ref 0–55)
ANION GAP: 7 meq/L (ref 3–11)
AST: 17 U/L (ref 5–34)
Alkaline Phosphatase: 87 U/L (ref 40–150)
BILIRUBIN TOTAL: 0.54 mg/dL (ref 0.20–1.20)
BUN: 17.6 mg/dL (ref 7.0–26.0)
CHLORIDE: 107 meq/L (ref 98–109)
CO2: 28 meq/L (ref 22–29)
Calcium: 10 mg/dL (ref 8.4–10.4)
Creatinine: 1 mg/dL (ref 0.7–1.3)
Glucose: 103 mg/dl (ref 70–140)
POTASSIUM: 4.6 meq/L (ref 3.5–5.1)
SODIUM: 141 meq/L (ref 136–145)
Total Protein: 7.1 g/dL (ref 6.4–8.3)

## 2014-05-25 NOTE — Progress Notes (Signed)
Hematology and Oncology Follow Up Visit  [name] [name] [id number] 03/30/1926 80 y.o. 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 His initial diagnosis was in 2010 with a Gleason score 4+3 equals 7 and a PSA of 9.43.   Prior Therapy:  He was treated with definitive radiation therapy after a brief period of hormone treatments with a PSA nadir of 0.08 for 2 years.  He subsequently developed bony metastasis and 2014 and a PSA up to 39. He was treated with androgen depravation and a PSA nadir to up to 5.23 and most recently his PSA is elevated again up to 29.64. His bone scan recently did not show any new disease.  Current therapy: Androgen depravation therapy in the form of Lupron under the care of Dr. Luberta Robertson. Xgeva given under the care of Dr. Luberta Robertson. Xtandi 160 mg by mouth daily. Therapy beginning 12/16/2013  Interim History:  [name] [name] presents today for a followup visit. Since his last visit, he has not reported any complications or illnesses. He continues to be on  Xtandi. He is tolerating it relatively well except for mild fatigue. He continues to be very functional and attends to activities of daily living. He is able to drive without any hindrance or decline.  He does not report any headaches or blurry vision or double vision. Does that report any back pain shoulder pain right hip pain. He does not report any fevers or chills or sweats. As that report any chest pain or palpitation. He does not report any nausea or vomiting or abdominal pain. Does not report any frequency urgency or hesitancy. Rest of his review of systems unremarkable.  Medications: I have reviewed the patient's current medications.  Current Outpatient Prescriptions  Medication Sig Dispense Refill  . Artificial Tear Solution (SOOTHE HYDRATION) 1.25 % SOLN Apply to eye.    Marland Kitchen aspirin 81 MG tablet Take 81 mg by mouth daily. Mon, wed,  fri    . calcium gluconate 500 MG tablet Take 250 mg by mouth daily.    . Calcium-Vitamin D-Vitamin K [id number] MG-UNT-MCG CHEW Chew by mouth daily.    Marland Kitchen denosumab (XGEVA) 120 MG/1.7ML SOLN injection Inject into the skin.    . enzalutamide (XTANDI) 40 MG capsule Take 4 capsules (160 mg total) by mouth daily. 120 capsule 0  . fish oil-omega-3 fatty acids 1000 MG capsule Take 1,250 mg by mouth daily.    Marland Kitchen latanoprost (XALATAN) 0.005 % ophthalmic solution Place 1 drop into both eyes.     Marland Kitchen MISC NATURAL PRODUCTS PO Take by mouth daily. Activia    . Multiple Vitamin (DAILY VITAMIN FORMULA PO) Take by mouth daily. OTC Men's One a Day (10 key nutrients).    . Multiple Vitamins-Minerals (ONE-A-DAY MENS 50+ ADVANTAGE) TABS Take by mouth.    . prednisoLONE acetate (PRED FORTE) 1 % ophthalmic suspension Place 1 drop into both eyes Nightly.    . thiamine (VITAMIN B-1) 50 MG tablet Take 50 mg by mouth daily.    . Vitamin D, Ergocalciferol, (DRISDOL) 50000 UNITS CAPS Take 50,000 Units by mouth every 14 (fourteen) days.     No current facility-administered medications for this visit.     Allergies:  Allergies  Allergen Reactions  . Statins     Muscle weakness in arms and legs    Past Medical History, Surgical history, Social history, and Family History were reviewed and updated.  Physical Exam: There were no vitals taken  for this visit. ECOG: 1 General appearance: alert and cooperative Head: Normocephalic, without obvious abnormality Neck: no adenopathy Lymph nodes: Cervical, supraclavicular, and axillary nodes normal. Heart:regular rate and rhythm, S1, S2 normal, no murmur, click, rub or gallop Lung:chest clear, no wheezing, rales, normal symmetric air entry Abdomin: soft, non-tender, without masses or organomegaly EXT:no erythema, induration, or nodules Neurologically intact without deficits.   Lab Results: Lab Results  Component Value Date   WBC 5.8 05/25/2014   HGB 13.0 05/25/2014    HCT 39.9 05/25/2014   MCV 90.9 05/25/2014   PLT 236 05/25/2014     Chemistry      Component Value Date/Time   NA 141 04/27/2014 0833   K 4.9 04/27/2014 0833   CO2 27 04/27/2014 0833   BUN 15.6 04/27/2014 0833   CREATININE 1.1 04/27/2014 0833      Component Value Date/Time   CALCIUM 10.1 04/27/2014 0833   ALKPHOS 82 04/27/2014 0833   AST 16 04/27/2014 0833   ALT 12 04/27/2014 0833   BILITOT 0.51 04/27/2014 0833         Results for [name] [name] (MRN [id number]) as of 05/25/2014 08:43  Ref. Range 03/17/2014 08:50 04/27/2014 08:33  PSA Latest Range: <=4.00 ng/mL 0.51 2.01    Impression and Plan:  80 year old gentleman with the following issues:  1. Castration resistant prostate cancer with metastatic disease to the bone. He is now being treated with Xtandi 160 mg daily and have tolerated it well. His PSA dropped dramatically from 50.48  to 0.51. His most recent PSA is up to 2.0 on. Despite this rise, he still have an excellent benefit to this medication. For the time being we'll continue to monitor closely and keep him on the same dose and schedule. If his PSA continues to rise rapidly than we'll switch to a different agent. In his PSA continue to be relatively well then we will continue on the current medication.  2. Androgen depravation: he will continue to receive that with Dr. Luberta Robertson.  3.Bone health: He will continue Xgeva the under the care of Dr. Luberta Robertson on a monthly basis.  4. Followup: He will be in 4-5 weeks to assess any toxicities related to [place].      [facility], MD 11/11/20158:41 AM

## 2014-05-25 NOTE — Telephone Encounter (Signed)
Gave avs & cal for Dec. °

## 2014-05-26 LAB — PSA: PSA: 4.77 ng/mL — AB (ref <=4.00)

## 2014-06-02 ENCOUNTER — Other Ambulatory Visit: Payer: Self-pay | Admitting: *Deleted

## 2014-06-02 ENCOUNTER — Telehealth: Payer: Self-pay

## 2014-06-02 DIAGNOSIS — C61 Malignant neoplasm of prostate: Secondary | ICD-10-CM

## 2014-06-02 MED ORDER — ENZALUTAMIDE 40 MG PO CAPS: 160.0000 mg | ORAL_CAPSULE | Freq: Every day | ORAL | Status: DC

## 2014-06-02 NOTE — Telephone Encounter (Signed)
Xtandi refill request to [facility] for signature

## 2014-06-03 NOTE — Telephone Encounter (Signed)
Received a fax from [facility]  confirming receipt of referral for [place].

## 2014-06-07 ENCOUNTER — Ambulatory Visit (INDEPENDENT_AMBULATORY_CARE_PROVIDER_SITE_OTHER): Payer: Medicare Other | Admitting: Urology

## 2014-06-07 DIAGNOSIS — C61 Malignant neoplasm of prostate: Secondary | ICD-10-CM

## 2014-06-07 DIAGNOSIS — C7951 Secondary malignant neoplasm of bone: Secondary | ICD-10-CM | POA: Diagnosis not present

## 2014-06-14 NOTE — Telephone Encounter (Signed)
RECEIVED A FAX FROM [facility] CONCERNING A CONFIRMATION OF PRESCRIPTION SHIPMENT FOR XTANDI ON 06/13/14.

## 2014-06-24 ENCOUNTER — Telehealth: Payer: Self-pay | Admitting: Oncology

## 2014-06-24 ENCOUNTER — Other Ambulatory Visit (HOSPITAL_BASED_OUTPATIENT_CLINIC_OR_DEPARTMENT_OTHER): Payer: Medicare Other

## 2014-06-24 ENCOUNTER — Ambulatory Visit (HOSPITAL_BASED_OUTPATIENT_CLINIC_OR_DEPARTMENT_OTHER): Payer: Medicare Other | Admitting: Physician Assistant

## 2014-06-24 ENCOUNTER — Encounter: Payer: Self-pay | Admitting: Physician Assistant

## 2014-06-24 VITALS — BP 150/56 | HR 88 | Temp 97.8°F | Resp 18 | Ht 69.0 in | Wt 147.6 lb

## 2014-06-24 DIAGNOSIS — C61 Malignant neoplasm of prostate: Secondary | ICD-10-CM

## 2014-06-24 DIAGNOSIS — C7951 Secondary malignant neoplasm of bone: Secondary | ICD-10-CM

## 2014-06-24 LAB — COMPREHENSIVE METABOLIC PANEL (CC13)
ALT: 11 U/L (ref 0–55)
ANION GAP: 11 meq/L (ref 3–11)
AST: 21 U/L (ref 5–34)
Albumin: 3 g/dL — ABNORMAL LOW (ref 3.5–5.0)
Alkaline Phosphatase: 77 U/L (ref 40–150)
BUN: 14.9 mg/dL (ref 7.0–26.0)
CALCIUM: 9.4 mg/dL (ref 8.4–10.4)
CO2: 24 meq/L (ref 22–29)
CREATININE: 1.1 mg/dL (ref 0.7–1.3)
Chloride: 107 mEq/L (ref 98–109)
EGFR: 63 mL/min/{1.73_m2} — ABNORMAL LOW (ref >90)
Glucose: 105 mg/dl (ref 70–140)
Potassium: 4.7 mEq/L (ref 3.5–5.1)
Sodium: 142 mEq/L (ref 136–145)
Total Bilirubin: 0.47 mg/dL (ref 0.20–1.20)
Total Protein: 6.5 g/dL (ref 6.4–8.3)

## 2014-06-24 LAB — CBC WITH DIFFERENTIAL/PLATELET
BASO%: 1.2 % (ref 0.0–2.0)
BASOS ABS: 0.1 10*3/uL (ref 0.0–0.1)
EOS%: 1.9 % (ref 0.0–7.0)
Eosinophils Absolute: 0.1 10*3/uL (ref 0.0–0.5)
HEMATOCRIT: 39.6 % (ref 38.4–49.9)
HEMOGLOBIN: 12.7 g/dL — AB (ref 13.0–17.1)
LYMPH%: 9.7 % — ABNORMAL LOW (ref 14.0–49.0)
MCH: 29.1 pg (ref 27.2–33.4)
MCHC: 32.1 g/dL (ref 32.0–36.0)
MCV: 90.7 fL (ref 79.3–98.0)
MONO#: 0.6 10*3/uL (ref 0.1–0.9)
MONO%: 10.9 % (ref 0.0–14.0)
NEUT#: 4.5 10*3/uL (ref 1.5–6.5)
NEUT%: 76.3 % — AB (ref 39.0–75.0)
Platelets: 345 10*3/uL (ref 140–400)
RBC: 4.37 10*6/uL (ref 4.20–5.82)
RDW: 13 % (ref 11.0–14.6)
WBC: 5.9 10*3/uL (ref 4.0–10.3)
lymph#: 0.6 10*3/uL — ABNORMAL LOW (ref 0.9–3.3)

## 2014-06-24 MED ORDER — FLUCONAZOLE 100 MG PO TABS: 100.0000 mg | ORAL_TABLET | Freq: Every day | ORAL | Status: DC

## 2014-06-24 NOTE — Patient Instructions (Signed)
Take the fluconazole as prescribed for the "thrush" affecting your mouth Continue Xtandin at the current dose Follow up in one month

## 2014-06-24 NOTE — Telephone Encounter (Signed)
Pt confirmed labs/ov per 12/11 POF, gave pt AVS..... KJ

## 2014-06-24 NOTE — Progress Notes (Signed)
Hematology and Oncology Follow Up Visit  [name] [name] [id number] 10/18/1925 80 y.o. 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 His initial diagnosis was in 2010 with a Gleason score 4+3 equals 7 and a PSA of 9.43.   Prior Therapy:  He was treated with definitive radiation therapy after a brief period of hormone treatments with a PSA nadir of 0.08 for 2 years.  He subsequently developed bony metastasis and 2014 and a PSA up to 39. He was treated with androgen depravation and a PSA nadir to up to 5.23 and most recently his PSA is elevated again up to 29.64. His bone scan recently did not show any new disease.  Current therapy: Androgen depravation therapy in the form of Lupron under the care of Dr. Luberta Robertson. Xgeva given under the care of Dr. Luberta Robertson. Xtandi 160 mg by mouth daily. Therapy beginning 12/16/2013  Interim History:  Mr. [name] [name] presents today for a followup visit. Since his last visit, he has not reported any complications or illnesses except for loss of taste and mild weight loss. He continues to be on  Xtandi. He is tolerating it relatively well except for mild fatigue. He continues to be very functional and attends to activities of daily living. He reports increased stress around his wife's recent illness with severe leg spasms. He is not sleeping well as a result. He states that she is heavily sedated and needs a lot of care. He reports losing his taste around Thanksgiving. Food just hasn't been tasting right. He is able to drive without any hindrance or decline.  He does not report any headaches or blurry vision or double vision. Does that report any back pain shoulder pain right hip pain. He does not report any fevers or chills or sweats. Does not report any chest pain or palpitation. He does not report any nausea or vomiting or abdominal pain. Does not report any frequency urgency or hesitancy.  He has had some loose stools but states he has a long standing history of "irritable bowel syndrome.  Remainder of his review of systems unremarkable.  Medications: I have reviewed the patient's current medications.  Current Outpatient Prescriptions  Medication Sig Dispense Refill  . Artificial Tear Solution (SOOTHE HYDRATION) 1.25 % SOLN Apply to eye.    Marland Kitchen aspirin 81 MG tablet Take 81 mg by mouth daily. Mon, wed, fri    . calcium gluconate 500 MG tablet Take 250 mg by mouth daily.    . Calcium-Vitamin D-Vitamin K [id number] MG-UNT-MCG CHEW Chew by mouth daily.    Marland Kitchen denosumab (XGEVA) 120 MG/1.7ML SOLN injection Inject into the skin.    . enzalutamide (XTANDI) 40 MG capsule Take 4 capsules (160 mg total) by mouth daily. 120 capsule 0  . fish oil-omega-3 fatty acids 1000 MG capsule Take 1,250 mg by mouth daily.    Marland Kitchen latanoprost (XALATAN) 0.005 % ophthalmic solution Place 1 drop into both eyes.     Marland Kitchen MISC NATURAL PRODUCTS PO Take by mouth daily. Activia    . Multiple Vitamin (DAILY VITAMIN FORMULA PO) Take by mouth daily. OTC Men's One a Day (10 key nutrients).    . Multiple Vitamins-Minerals (ONE-A-DAY MENS 50+ ADVANTAGE) TABS Take by mouth.    . prednisoLONE acetate (PRED FORTE) 1 % ophthalmic suspension Place 1 drop into both eyes Nightly.    . thiamine (VITAMIN B-1) 50 MG tablet Take 50 mg by mouth daily.    Marland Kitchen  Vitamin D, Ergocalciferol, (DRISDOL) 50000 UNITS CAPS Take 50,000 Units by mouth every 14 (fourteen) days.    . fluconazole (DIFLUCAN) 100 MG tablet Take 1 tablet (100 mg total) by mouth daily. 10 tablet 0   No current facility-administered medications for this visit.     Allergies:  Allergies  Allergen Reactions  . Statins     Muscle weakness in arms and legs    Past Medical History, Surgical history, Social history, and Family History were reviewed and updated.  Physical Exam: Blood pressure 150/56, pulse 88, temperature 97.8 F (36.6 C), temperature source Oral, resp.  rate 18, height 5\' 9"  (1.753 m), weight 147 lb 9.6 oz (66.951 kg). ECOG: 1 General appearance: alert and cooperative Head: Normocephalic, without obvious abnormality Neck: no adenopathy Lymph nodes: Cervical, supraclavicular, and axillary nodes normal. Heart:regular rate and rhythm, S1, S2 normal, no murmur, click, rub or gallop Lung:chest clear, no wheezing, rales, normal symmetric air entry Abdomin: soft, non-tender, without masses or organomegaly EXT:no erythema, induration, or nodules Neurologically intact without deficits.  Mouth: reveals oral candidiasis on the tongue and buccal mucosa  Lab Results: Lab Results  Component Value Date   WBC 5.9 06/24/2014   HGB 12.7* 06/24/2014   HCT 39.6 06/24/2014   MCV 90.7 06/24/2014   PLT 345 06/24/2014     Chemistry      Component Value Date/Time   NA 142 06/24/2014 0910   K 4.7 06/24/2014 0910   CO2 24 06/24/2014 0910   BUN 14.9 06/24/2014 0910   CREATININE 1.1 06/24/2014 0910      Component Value Date/Time   CALCIUM 9.4 06/24/2014 0910   ALKPHOS 77 06/24/2014 0910   AST 21 06/24/2014 0910   ALT 11 06/24/2014 0910   BILITOT 0.47 06/24/2014 0910         Results for OLSEN, MCCUTCHAN (MRN [id number]) as of 05/25/2014 08:43  Ref. Range 03/17/2014 08:50 04/27/2014 08:33  PSA Latest Range: <=4.00 ng/mL 0.51 2.01    Impression and Plan:  80 year old gentleman with the following issues:  1. Castration resistant prostate cancer with metastatic disease to the bone. He is now being treated with Xtandi 160 mg daily and have tolerated it well. His PSA dropped dramatically from 50.48  to 0.51. His most recent PSA is up to 2.0 on. Despite this rise, he still have an excellent benefit to this medication. For the time being we'll continue to monitor closely and keep him on the same dose and schedule. If his PSA continues to rise rapidly than we'll switch to a different agent. In his PSA continue to be relatively stable then we will  continue on the current medication.  2. Androgen depravation: he will continue to receive that with Dr. Luberta Robertson.  3.Bone health: He will continue Xgeva the under the care of Dr. Luberta Robertson on a monthly basis.  4. Oral candidiasis - a prescription for Diflucan was sent to his pharmacy of record via [facility]  5. Followup: He will be in 4-5 weeks to assess any toxicities related to [facility].      Awilda Metro E, PA-C  12/11/201512:32 PM

## 2014-06-25 LAB — PSA: PSA: 15.53 ng/mL — ABNORMAL HIGH (ref <=4.00)

## 2014-07-01 ENCOUNTER — Other Ambulatory Visit: Payer: Self-pay | Admitting: *Deleted

## 2014-07-01 NOTE — Telephone Encounter (Signed)
THIS REFILL REQUEST FOR XTANDI WAS PLACED IN DR.SHADAD'S ACTIVE WORK FOLDER. 

## 2014-07-04 ENCOUNTER — Other Ambulatory Visit: Payer: Self-pay | Admitting: *Deleted

## 2014-07-04 ENCOUNTER — Encounter: Payer: Self-pay | Admitting: *Deleted

## 2014-07-04 DIAGNOSIS — C61 Malignant neoplasm of prostate: Secondary | ICD-10-CM

## 2014-07-04 MED ORDER — ENZALUTAMIDE 40 MG PO CAPS: 160.0000 mg | ORAL_CAPSULE | Freq: Every day | ORAL | Status: DC

## 2014-07-19 ENCOUNTER — Ambulatory Visit (INDEPENDENT_AMBULATORY_CARE_PROVIDER_SITE_OTHER): Payer: Medicare Other | Admitting: Urology

## 2014-07-19 DIAGNOSIS — C61 Malignant neoplasm of prostate: Secondary | ICD-10-CM

## 2014-07-26 ENCOUNTER — Other Ambulatory Visit (HOSPITAL_BASED_OUTPATIENT_CLINIC_OR_DEPARTMENT_OTHER): Payer: Medicare Other

## 2014-07-26 ENCOUNTER — Ambulatory Visit (HOSPITAL_BASED_OUTPATIENT_CLINIC_OR_DEPARTMENT_OTHER): Payer: Medicare Other | Admitting: Oncology

## 2014-07-26 ENCOUNTER — Telehealth: Payer: Self-pay | Admitting: Oncology

## 2014-07-26 VITALS — BP 125/57 | HR 83 | Temp 97.5°F | Resp 18 | Ht 69.0 in | Wt 146.6 lb

## 2014-07-26 DIAGNOSIS — C7951 Secondary malignant neoplasm of bone: Secondary | ICD-10-CM

## 2014-07-26 DIAGNOSIS — C61 Malignant neoplasm of prostate: Secondary | ICD-10-CM

## 2014-07-26 LAB — CBC WITH DIFFERENTIAL/PLATELET
BASO%: 1.1 % (ref 0.0–2.0)
Basophils Absolute: 0.1 10*3/uL (ref 0.0–0.1)
EOS ABS: 0.1 10*3/uL (ref 0.0–0.5)
EOS%: 1.5 % (ref 0.0–7.0)
HCT: 39.4 % (ref 38.4–49.9)
HGB: 12.4 g/dL — ABNORMAL LOW (ref 13.0–17.1)
LYMPH%: 12 % — ABNORMAL LOW (ref 14.0–49.0)
MCH: 28.6 pg (ref 27.2–33.4)
MCHC: 31.5 g/dL — AB (ref 32.0–36.0)
MCV: 90.6 fL (ref 79.3–98.0)
MONO#: 0.5 10*3/uL (ref 0.1–0.9)
MONO%: 9.8 % (ref 0.0–14.0)
NEUT#: 3.8 10*3/uL (ref 1.5–6.5)
NEUT%: 75.6 % — ABNORMAL HIGH (ref 39.0–75.0)
Platelets: 261 10*3/uL (ref 140–400)
RBC: 4.35 10*6/uL (ref 4.20–5.82)
RDW: 14 % (ref 11.0–14.6)
WBC: 5 10*3/uL (ref 4.0–10.3)
lymph#: 0.6 10*3/uL — ABNORMAL LOW (ref 0.9–3.3)

## 2014-07-26 LAB — COMPREHENSIVE METABOLIC PANEL (CC13)
ALBUMIN: 3.1 g/dL — AB (ref 3.5–5.0)
ALK PHOS: 70 U/L (ref 40–150)
ALT: 7 U/L (ref 0–55)
ANION GAP: 9 meq/L (ref 3–11)
AST: 18 U/L (ref 5–34)
BILIRUBIN TOTAL: 0.43 mg/dL (ref 0.20–1.20)
BUN: 15.4 mg/dL (ref 7.0–26.0)
CHLORIDE: 104 meq/L (ref 98–109)
CO2: 26 mEq/L (ref 22–29)
Calcium: 8.6 mg/dL (ref 8.4–10.4)
Creatinine: 0.9 mg/dL (ref 0.7–1.3)
EGFR: 74 mL/min/{1.73_m2} — AB (ref >90)
GLUCOSE: 157 mg/dL — AB (ref 70–140)
Potassium: 4.4 mEq/L (ref 3.5–5.1)
SODIUM: 138 meq/L (ref 136–145)
Total Protein: 6.2 g/dL — ABNORMAL LOW (ref 6.4–8.3)

## 2014-07-26 MED ORDER — DICLOFENAC SODIUM 1 % TD GEL: 2.0000 g | Freq: Four times a day (QID) | TRANSDERMAL | Status: AC

## 2014-07-26 NOTE — Progress Notes (Signed)
Hematology and Oncology Follow Up Visit  [name] [name] [id number] 1925/10/12 81 y.o. 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 His initial diagnosis was in 2010 with a Gleason score 4+3 equals 7 and a PSA of 9.43.   Prior Therapy:  He was treated with definitive radiation therapy after a brief period of hormone treatments with a PSA nadir of 0.08 for 2 years.  He subsequently developed bony metastasis and 2014 and a PSA up to 39. He was treated with androgen depravation and a PSA nadir to up to 5.23 and most recently his PSA is elevated again up to 29.64. His bone scan recently did not show any new disease.  Current therapy: Androgen depravation therapy in the form of Lupron under the care of Dr. Luberta Robertson. Xgeva given under the care of Dr. Luberta Robertson. Xtandi 160 mg by mouth daily. Therapy beginning 12/16/2013  Interim History:  Mr. Lynam presents today for a followup visit. Since his last visit, he is reporting more fatigue than usual. His reporting that his ability to perform activities of daily living is intact but states in him longer than usual. His stamina have also decreased. He continues to be on  Xtandi without any other complications. He does report a right-sided shoulder pain which has been exacerbated while moving furniture. He does report a lot of anxiety associated with taking care of his wife's health. He is eating slightly better since his thrush has been treated and his swallowing has improved. He does not report any headaches or blurry vision or double vision. Does that report any back pain shoulder pain right hip pain. He does not report any fevers or chills or sweats. Does not report any chest pain or palpitation. He does not report any nausea or vomiting or abdominal pain. Does not report any frequency urgency or hesitancy. He has had some loose stools but states he has a long standing history  of "irritable bowel syndrome.  Remainder of his review of systems unremarkable.  Medications: I have reviewed the patient's current medications.  Current Outpatient Prescriptions  Medication Sig Dispense Refill  . Artificial Tear Solution (SOOTHE HYDRATION) 1.25 % SOLN Apply to eye.    Marland Kitchen aspirin 81 MG tablet Take 81 mg by mouth daily. Mon, wed, fri    . calcium gluconate 500 MG tablet Take 250 mg by mouth daily.    Marland Kitchen denosumab (XGEVA) 120 MG/1.7ML SOLN injection Inject into the skin.    . enzalutamide (XTANDI) 40 MG capsule Take 4 capsules (160 mg total) by mouth daily. 120 capsule 0  . fish oil-omega-3 fatty acids 1000 MG capsule Take 1,250 mg by mouth daily.    . Multiple Vitamin (DAILY VITAMIN FORMULA PO) Take by mouth daily. OTC Men's One a Day (10 key nutrients).    . prednisoLONE acetate (PRED FORTE) 1 % ophthalmic suspension Place 1 drop into both eyes Nightly.    . thiamine (VITAMIN B-1) 50 MG tablet Take 50 mg by mouth daily.    . Vitamin D, Ergocalciferol, (DRISDOL) 50000 UNITS CAPS Take 50,000 Units by mouth every 14 (fourteen) days.    . diclofenac sodium (VOLTAREN) 1 % GEL Apply 2 g topically 4 (four) times daily. 100 g 3   No current facility-administered medications for this visit.     Allergies:  Allergies  Allergen Reactions  . Statins     Muscle weakness in arms and legs    Past Medical History,  Surgical history, Social history, and Family History were reviewed and updated.  Physical Exam: Blood pressure 125/57, pulse 83, temperature 97.5 F (36.4 C), temperature source Oral, resp. rate 18, height 5\' 9"  (1.753 m), weight 146 lb 9.6 oz (66.497 kg), SpO2 99 %. ECOG: 1 General appearance: alert and cooperative Head: Normocephalic, without obvious abnormality Neck: no adenopathy Lymph nodes: Cervical, supraclavicular, and axillary nodes normal. Heart:regular rate and rhythm, S1, S2 normal, no murmur, click, rub or gallop Lung:chest clear, no wheezing, rales,  normal symmetric air entry Abdomin: soft, non-tender, without masses or organomegaly EXT:no erythema, induration, or nodules Neurologically intact without deficits.  Mouth: Oral thrush resolved at this time.  Lab Results: Lab Results  Component Value Date   WBC 5.0 07/26/2014   HGB 12.4* 07/26/2014   HCT 39.4 07/26/2014   MCV 90.6 07/26/2014   PLT 261 07/26/2014     Chemistry      Component Value Date/Time   NA 138 07/26/2014 1347   K 4.4 07/26/2014 1347   CO2 26 07/26/2014 1347   BUN 15.4 07/26/2014 1347   CREATININE 0.9 07/26/2014 1347      Component Value Date/Time   CALCIUM 8.6 07/26/2014 1347   ALKPHOS 70 07/26/2014 1347   AST 18 07/26/2014 1347   ALT 7 07/26/2014 1347   BILITOT 0.43 07/26/2014 1347       Results for TYDEN, KANN (MRN [id number]) as of 07/26/2014 14:38  Ref. Range 04/27/2014 08:33 05/25/2014 08:12 06/24/2014 09:10  PSA Latest Range: <=4.00 ng/mL 2.01 4.77 (H) 15.53 (H)     Impression and Plan:  81 year old gentleman with the following issues:  1. Castration resistant prostate cancer with metastatic disease to the bone. He is now being treated with Xtandi 160 mg daily and have tolerated it well. His PSA dropped dramatically from 50.48  to 0.51. His most recent PSA is up to 15.53 and he is reporting more side effects associated with Xtandi. The plan is to continue on Xtandi for the time being that reduced the dose to 120 mg daily. If he continues to have fatigue and anorexia symptoms, we will reduce the dose further.  2. Androgen depravation: he will continue to receive that with Dr. Luberta Robertson.  3.Bone health: He will continue Xgeva the under the care of Dr. Luberta Robertson on a monthly basis.  4. Oral candidiasis : This have resolved at this time.  5. Right-sided shoulder pain: Appears to be arthritic in nature and I prescribed him Voltaren cream.  6. Followup: He will be in 4-5 weeks to assess any toxicities related to  [place].      [facility], MD 1/12/20163:06 PM

## 2014-07-26 NOTE — Telephone Encounter (Signed)
gv adn printed appt sched anda vs for pt for Feb °

## 2014-07-27 LAB — PSA: PSA: 21.84 ng/mL — ABNORMAL HIGH (ref <=4.00)

## 2014-08-02 ENCOUNTER — Other Ambulatory Visit: Payer: Self-pay | Admitting: Urology

## 2014-08-02 ENCOUNTER — Ambulatory Visit (INDEPENDENT_AMBULATORY_CARE_PROVIDER_SITE_OTHER): Payer: Medicare Other | Admitting: Urology

## 2014-08-02 DIAGNOSIS — C61 Malignant neoplasm of prostate: Secondary | ICD-10-CM

## 2014-08-02 DIAGNOSIS — C7951 Secondary malignant neoplasm of bone: Secondary | ICD-10-CM

## 2014-08-02 DIAGNOSIS — C7952 Secondary malignant neoplasm of bone marrow: Secondary | ICD-10-CM

## 2014-08-03 ENCOUNTER — Other Ambulatory Visit: Payer: Self-pay | Admitting: *Deleted

## 2014-08-03 DIAGNOSIS — C61 Malignant neoplasm of prostate: Secondary | ICD-10-CM

## 2014-08-03 MED ORDER — ENZALUTAMIDE 40 MG PO CAPS: 160.0000 mg | ORAL_CAPSULE | Freq: Every day | ORAL | Status: DC

## 2014-08-09 ENCOUNTER — Encounter (HOSPITAL_COMMUNITY): Payer: Self-pay

## 2014-08-09 ENCOUNTER — Encounter (HOSPITAL_COMMUNITY)
Admission: RE | Admit: 2014-08-09 | Discharge: 2014-08-09 | Disposition: A | Discharge status: Home / Self Care | Payer: Medicare Other | Source: Ambulatory Visit | Attending: Urology | Admitting: Urology

## 2014-08-09 DIAGNOSIS — C7952 Secondary malignant neoplasm of bone marrow: Secondary | ICD-10-CM

## 2014-08-09 DIAGNOSIS — C61 Malignant neoplasm of prostate: Secondary | ICD-10-CM | POA: Diagnosis not present

## 2014-08-09 DIAGNOSIS — C7951 Secondary malignant neoplasm of bone: Secondary | ICD-10-CM | POA: Diagnosis present

## 2014-08-09 MED ORDER — TECHNETIUM TC 99M MEDRONATE IV KIT
20.0000 | PACK | Freq: Once | INTRAVENOUS | Status: AC | PRN
  Administered 2014-08-09: 20 via INTRAVENOUS

## 2014-08-10 ENCOUNTER — Other Ambulatory Visit: Payer: Self-pay | Admitting: *Deleted

## 2014-08-10 DIAGNOSIS — C61 Malignant neoplasm of prostate: Secondary | ICD-10-CM

## 2014-08-10 MED ORDER — ENZALUTAMIDE 40 MG PO CAPS: 160.0000 mg | ORAL_CAPSULE | Freq: Every day | ORAL | Status: DC

## 2014-08-11 ENCOUNTER — Other Ambulatory Visit: Payer: Self-pay | Admitting: *Deleted

## 2014-08-11 DIAGNOSIS — C61 Malignant neoplasm of prostate: Secondary | ICD-10-CM

## 2014-08-11 MED ORDER — ENZALUTAMIDE 40 MG PO CAPS: 120.0000 mg | ORAL_CAPSULE | Freq: Every day | ORAL | Status: DC

## 2014-08-16 ENCOUNTER — Telehealth: Payer: Self-pay

## 2014-08-16 NOTE — Telephone Encounter (Signed)
Received fax from [facility] they received our referral for xtandi

## 2014-08-18 ENCOUNTER — Telehealth: Payer: Self-pay | Admitting: *Deleted

## 2014-08-18 NOTE — Telephone Encounter (Signed)
Humberto Leep called to say that [facility] is changing to [facility].  Patient's [name] [name] is ready to be shipped, however they need the patient to contact them and arrange for shipping.  She asks that I contact the patient and ask him to call the pharmacy at [phone]. Called patient and left him a message requesting that he call the pharmacy and arrange for shipping for his Xtandi.

## 2014-08-19 ENCOUNTER — Other Ambulatory Visit (HOSPITAL_COMMUNITY): Payer: Self-pay | Admitting: Preventative Medicine

## 2014-08-19 DIAGNOSIS — R9389 Abnormal findings on diagnostic imaging of other specified body structures: Secondary | ICD-10-CM

## 2014-08-23 ENCOUNTER — Ambulatory Visit (INDEPENDENT_AMBULATORY_CARE_PROVIDER_SITE_OTHER): Payer: Medicare Other | Admitting: Urology

## 2014-08-23 DIAGNOSIS — J90 Pleural effusion, not elsewhere classified: Secondary | ICD-10-CM

## 2014-08-23 DIAGNOSIS — C61 Malignant neoplasm of prostate: Secondary | ICD-10-CM

## 2014-08-23 DIAGNOSIS — C7951 Secondary malignant neoplasm of bone: Secondary | ICD-10-CM

## 2014-08-24 ENCOUNTER — Other Ambulatory Visit (HOSPITAL_COMMUNITY)
Admission: RE | Admit: 2014-08-24 | Discharge: 2014-08-24 | Disposition: A | Discharge status: Home / Self Care | Payer: Medicare Other | Source: Ambulatory Visit | Attending: Preventative Medicine | Admitting: Preventative Medicine

## 2014-08-24 ENCOUNTER — Ambulatory Visit (HOSPITAL_COMMUNITY)
Admission: RE | Admit: 2014-08-24 | Discharge: 2014-08-24 | Disposition: A | Discharge status: Home / Self Care | Payer: Medicare Other | Source: Ambulatory Visit | Attending: Preventative Medicine | Admitting: Preventative Medicine

## 2014-08-24 DIAGNOSIS — R9389 Abnormal findings on diagnostic imaging of other specified body structures: Secondary | ICD-10-CM

## 2014-08-24 DIAGNOSIS — R0602 Shortness of breath: Secondary | ICD-10-CM | POA: Diagnosis not present

## 2014-08-24 DIAGNOSIS — R938 Abnormal findings on diagnostic imaging of other specified body structures: Secondary | ICD-10-CM | POA: Diagnosis not present

## 2014-08-24 DIAGNOSIS — C61 Malignant neoplasm of prostate: Secondary | ICD-10-CM | POA: Insufficient documentation

## 2014-08-24 LAB — CREATININE, SERUM
CREATININE: 0.95 mg/dL (ref 0.50–1.35)
GFR calc Af Amer: 85 mL/min — ABNORMAL LOW (ref >90)
GFR calc non Af Amer: 73 mL/min — ABNORMAL LOW (ref >90)

## 2014-08-24 LAB — BUN: BUN: 15 mg/dL (ref 6–23)

## 2014-08-24 MED ORDER — IOHEXOL 300 MG/ML  SOLN
80.0000 mL | Freq: Once | INTRAMUSCULAR | Status: AC | PRN
  Administered 2014-08-24: 80 mL via INTRAVENOUS

## 2014-08-31 ENCOUNTER — Ambulatory Visit (HOSPITAL_BASED_OUTPATIENT_CLINIC_OR_DEPARTMENT_OTHER): Payer: Medicare Other | Admitting: Oncology

## 2014-08-31 ENCOUNTER — Telehealth: Payer: Self-pay | Admitting: Oncology

## 2014-08-31 ENCOUNTER — Other Ambulatory Visit (HOSPITAL_BASED_OUTPATIENT_CLINIC_OR_DEPARTMENT_OTHER): Payer: Medicare Other

## 2014-08-31 VITALS — BP 131/63 | HR 98 | Temp 97.8°F | Resp 17 | Ht 69.0 in | Wt 141.2 lb

## 2014-08-31 DIAGNOSIS — C61 Malignant neoplasm of prostate: Secondary | ICD-10-CM

## 2014-08-31 DIAGNOSIS — C7951 Secondary malignant neoplasm of bone: Secondary | ICD-10-CM

## 2014-08-31 LAB — CBC WITH DIFFERENTIAL/PLATELET
BASO%: 0.4 % (ref 0.0–2.0)
Basophils Absolute: 0 10*3/uL (ref 0.0–0.1)
EOS ABS: 0.1 10*3/uL (ref 0.0–0.5)
EOS%: 1.8 % (ref 0.0–7.0)
HEMATOCRIT: 40.6 % (ref 38.4–49.9)
HGB: 13.4 g/dL (ref 13.0–17.1)
LYMPH%: 9.9 % — AB (ref 14.0–49.0)
MCH: 28.8 pg (ref 27.2–33.4)
MCHC: 33 g/dL (ref 32.0–36.0)
MCV: 87.3 fL (ref 79.3–98.0)
MONO#: 0.6 10*3/uL (ref 0.1–0.9)
MONO%: 9.6 % (ref 0.0–14.0)
NEUT%: 78.3 % — AB (ref 39.0–75.0)
NEUTROS ABS: 5.2 10*3/uL (ref 1.5–6.5)
PLATELETS: 268 10*3/uL (ref 140–400)
RBC: 4.65 10*6/uL (ref 4.20–5.82)
RDW: 14 % (ref 11.0–14.6)
WBC: 6.7 10*3/uL (ref 4.0–10.3)
lymph#: 0.7 10*3/uL — ABNORMAL LOW (ref 0.9–3.3)

## 2014-08-31 LAB — COMPREHENSIVE METABOLIC PANEL (CC13)
ALBUMIN: 3.2 g/dL — AB (ref 3.5–5.0)
ALT: 9 U/L (ref 0–55)
ANION GAP: 9 meq/L (ref 3–11)
AST: 21 U/L (ref 5–34)
Alkaline Phosphatase: 70 U/L (ref 40–150)
BILIRUBIN TOTAL: 0.61 mg/dL (ref 0.20–1.20)
BUN: 12.4 mg/dL (ref 7.0–26.0)
CO2: 28 mEq/L (ref 22–29)
Calcium: 9 mg/dL (ref 8.4–10.4)
Chloride: 100 mEq/L (ref 98–109)
Creatinine: 0.9 mg/dL (ref 0.7–1.3)
EGFR: 77 mL/min/{1.73_m2} — ABNORMAL LOW (ref >90)
GLUCOSE: 103 mg/dL (ref 70–140)
Potassium: 4.7 mEq/L (ref 3.5–5.1)
Sodium: 137 mEq/L (ref 136–145)
TOTAL PROTEIN: 6.1 g/dL — AB (ref 6.4–8.3)

## 2014-08-31 MED ORDER — PREDNISONE 5 MG PO TABS: ORAL_TABLET | ORAL | Status: DC

## 2014-08-31 MED ORDER — ABIRATERONE ACETATE 250 MG PO TABS: 1000.0000 mg | ORAL_TABLET | Freq: Every day | ORAL | Status: DC

## 2014-08-31 NOTE — Progress Notes (Signed)
Hematology and Oncology Follow Up Visit  [name] [name] [id number] Apr 06, 1926 81 y.o. 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 His initial diagnosis was in 2010 with a Gleason score 4+3 equals 7 and a PSA of 9.43.   Prior Therapy:  He was treated with definitive radiation therapy after a brief period of hormone treatments with a PSA nadir of 0.08 for 2 years.  He subsequently developed bony metastasis and 2014 and a PSA up to 39. He was treated with androgen depravation and a PSA nadir to up to 5.23 and most recently his PSA is elevated again up to 29.64. His bone scan recently did not show any new disease.  Current therapy: Androgen depravation therapy in the form of Lupron under the care of Dr. Luberta Robertson. Xgeva given under the care of Dr. Luberta Robertson. Xtandi 160 mg by mouth daily. Therapy beginning 12/16/2013  Interim History:  [name] [name] presents today for a followup visit. Since his last visit, he is reporting more deterioration in his health. He was seen at urgent care for symptoms of shortness of breath and had a CT scan that suggested possible pneumonia. His appetite have been relatively poor and he is developing more fatigue. His reporting that his ability to perform activities of daily living is intact but states in him longer than usual. His stamina have also decreased. He continues to be on  Xtandi without any other complications.. He does report a lot of anxiety associated with taking care of his wife's health who is currently hospitalized after fracturing her hip. He is eating slightly better since his thrush has been treated and his swallowing has improved. He does not report any headaches or blurry vision or double vision. Does that report any back pain shoulder pain right hip pain. He does not report any fevers or chills or sweats. Does not report any chest pain or palpitation. He does not report any  nausea or vomiting or abdominal pain. Does not report any frequency urgency or hesitancy. He has had some loose stools at times which have not changed.  Remainder of his review of systems unremarkable.  Medications: I have reviewed the patient's current medications.  Current Outpatient Prescriptions  Medication Sig Dispense Refill  . Artificial Tear Solution (SOOTHE HYDRATION) 1.25 % SOLN Apply to eye.    Marland Kitchen aspirin 81 MG tablet Take 81 mg by mouth daily. Mon, wed, fri    . denosumab (XGEVA) 120 MG/1.7ML SOLN injection Inject into the skin.    Marland Kitchen diclofenac sodium (VOLTAREN) 1 % GEL Apply 2 g topically 4 (four) times daily. 100 g 3  . Multiple Vitamin (DAILY VITAMIN FORMULA PO) Take by mouth daily. OTC Men's One a Day (10 key nutrients).    . prednisoLONE acetate (PRED FORTE) 1 % ophthalmic suspension Place 1 drop into both eyes Nightly.    . thiamine (VITAMIN B-1) 50 MG tablet Take 50 mg by mouth daily.    . Vitamin D, Ergocalciferol, (DRISDOL) 50000 UNITS CAPS Take 50,000 Units by mouth every 14 (fourteen) days.    Marland Kitchen abiraterone Acetate (ZYTIGA) 250 MG tablet Take 4 tablets (1,000 mg total) by mouth daily. Take on an empty stomach 1 hour before or 2 hours after a meal 120 tablet 0  . predniSONE (DELTASONE) 5 MG tablet Take one tablet daily with Zytiga. 60 tablet 0   No current facility-administered medications for this visit.     Allergies:  Allergies  Allergen Reactions  . Statins Other (See Comments)    Muscle weakness in arms and legs    Past Medical History, Surgical history, Social history, and Family History were reviewed and updated.  Physical Exam: Blood pressure 131/63, pulse 98, temperature 97.8 F (36.6 C), temperature source Oral, resp. rate 17, height 5\' 9"  (1.753 m), weight 141 lb 3.2 oz (64.048 kg), SpO2 99 %. ECOG: 2 General appearance: alert and cooperative. Appeared chronically ill. Head: Normocephalic, without obvious abnormality Neck: no adenopathy Lymph nodes:  Cervical, supraclavicular, and axillary nodes normal. Heart:regular rate and rhythm, S1, S2 normal, no murmur, click, rub or gallop Lung:chest clear, no wheezing, rales, normal symmetric air entry Abdomin: soft, non-tender, without masses or organomegaly EXT:no erythema, induration, or nodules Neurologically intact without deficits.  Mouth: Oral thrush resolved at this time.  Lab Results: Lab Results  Component Value Date   WBC 6.7 08/31/2014   HGB 13.4 08/31/2014   HCT 40.6 08/31/2014   MCV 87.3 08/31/2014   PLT 268 08/31/2014     Chemistry      Component Value Date/Time   NA 138 07/26/2014 1347   K 4.4 07/26/2014 1347   CO2 26 07/26/2014 1347   BUN 15 08/24/2014 1405   BUN 15.4 07/26/2014 1347   CREATININE 0.95 08/24/2014 1405   CREATININE 0.9 07/26/2014 1347      Component Value Date/Time   CALCIUM 8.6 07/26/2014 1347   ALKPHOS 70 07/26/2014 1347   AST 18 07/26/2014 1347   ALT 7 07/26/2014 1347   BILITOT 0.43 07/26/2014 1347     Results for [name] [name] (MRN [id number]) as of 08/31/2014 14:47  Ref. Range 05/25/2014 08:12 06/24/2014 09:10 07/26/2014 13:48  PSA Latest Range: <=4.00 ng/mL 4.77 (H) 15.53 (H) 21.84 (H)     EXAM: CT CHEST WITH CONTRAST  TECHNIQUE: Multidetector CT imaging of the chest was performed during intravenous contrast administration.  CONTRAST: 23mL OMNIPAQUE IOHEXOL 300 MG/ML SOLN  COMPARISON: 11/23/2013  FINDINGS: Chest wall: No supraclavicular or axillary mass or adenopathy. The thyroid gland is grossly normal. The bony thorax is intact. There is an enlarging L1 sclerotic lesion. No new sclerotic lesions are identified.  Mediastinum: The heart is normal in size. No pericardial effusion. Small amount of pericardial fluid. Stable borderline enlarged pretracheal lymph node. The aorta is normal in caliber. No dissection. Stable three-vessel coronary artery calcifications. The esophagus is grossly normal.  Lungs/pleura:  There are moderate sized bilateral pleural effusions with overlying atelectasis, right greater than left. There are air patchy ground-glass opacities and peripheral airspace disease in both lungs which are most likely infectious. No worrisome pulmonary nodules.  Upper abdomen: Diffuse fatty infiltration of the liver but no focal hepatic lesions. Stable mild splenic enlargement. There is mixing blood and contrast in the portal vein but no definite findings for thrombus. The pancreas is atrophied and there are it dense splenic artery calcifications. No upper abdominal mass or adenopathy.  IMPRESSION: 1. Moderate-sized bilateral pleural effusions. 2. Patchy peripheral ground-glass opacity and airspace disease most likely infiltrates/pneumonia. Recommend a followup post treatment chest x-rays to make sure these resolve.  EXAM: NUCLEAR MEDICINE WHOLE BODY BONE SCAN  TECHNIQUE: Whole body anterior and posterior images were obtained approximately 3 hours after intravenous injection of radiopharmaceutical. SPECT imaging of the thoracolumbar spine was also performed.  RADIOPHARMACEUTICALS: 20 mCi Technetium-90m MDP IV  COMPARISON: 10/18/2013  Radiographic correlation: CT chest abdomen and pelvis 11/23/2013  FINDINGS: Increased uptake at anterior RIGHT iliac wing increased in extent  since previous exams.  Uptake at the shoulders, sternoclavicular joints and cervical spine, typically degenerative.  Uptake at a costovertebral junction of a lower LEFT rib approximately T9, typically degenerative.  Significant tracer uptake within the expected course of the superficial femoral arteries bilaterally.  Retention of tracer within urinary bladder with increased tracer retention within a BILATERAL ureters renal collecting systems, question related to bladder outlet obstruction.  Small focus of tracer extravasation at the RIGHT antecubital fossa at site of  injection.  Uptake at L1 vertebral body new since prior bone scan, corresponding to sclerotic lesion on CT.  Heterogeneous distribution of tracer within the ribs bilaterally, nonspecific.  No other definite sites of osseous metastatic disease seen.  Diffuse tracer activity within the inferior pelvis on anterior and posterior images is likely related to an artifact from significant bladder retention of tracer.  IMPRESSION: Increased uptake at L1 vertebral body and in RIGHT iliac wing compatible with progressive osseous metastatic disease.  Question bladder outlet obstruction with increased retention of tracer within bladder, ureters and renal collecting systems.  Mottled appearance of ribs without definite additional sites of increased tracer to suggest additional osseous metastases.      Impression and Plan:  81 year old gentleman with the following issues:  1. Castration resistant prostate cancer with metastatic disease to the bone. He is now being treated with Xtandi 160 mg daily and have tolerated it well. His PSA dropped dramatically from 50.48  to 0.51. His most recent PSA is up to 21.84. Imaging studies including CT scan of the chest as well as bone scan were reviewed today and showed progression of disease. Options of treatments were discussed today including using Zytiga, systemic chemotherapy, Xofigo or possible hospice. I explained to him that regardless to the treatment option, he has limited life expectancy with overall poor prognosis.  After discussing the risks and benefits he elected to proceed with Zytiga. Complications include fatigue, tiredness, lower extremity edema, electrolyte imbalance were discussed and written information was given to the patient. I anticipate starting him at 1000 mg daily with prednisone at 5 mg daily.  2. Androgen depravation: he will continue to receive that with Dr. Luberta Robertson.  3.Bone health: He will continue Xgeva the under  the care of Dr. Luberta Robertson on a monthly basis.  4. Advanced directives: This was discussed with the patient and he has a living will as well as power of attorney. He understands he has limited life expectancy and he is making arrangements for end-of-life care at some point.  5. Right-sided shoulder pain: Appears to be arthritic in nature and seems to have improved.  6. Followup: He will be in 4-5 weeks to assess any toxicities related to Zytiga.       [facility], MD 2/17/20163:24 PM

## 2014-08-31 NOTE — Telephone Encounter (Signed)
Gave avs & calendar for March. °

## 2014-09-01 ENCOUNTER — Encounter: Payer: Self-pay | Admitting: Oncology

## 2014-09-01 ENCOUNTER — Encounter (HOSPITAL_COMMUNITY): Payer: Self-pay

## 2014-09-01 ENCOUNTER — Emergency Department (HOSPITAL_COMMUNITY): Payer: Medicare Other

## 2014-09-01 ENCOUNTER — Emergency Department (HOSPITAL_COMMUNITY)
Admission: EM | Admit: 2014-09-01 | Discharge: 2014-09-01 | Disposition: A | Discharge status: Home / Self Care | Payer: Medicare Other | Attending: Emergency Medicine | Admitting: Emergency Medicine

## 2014-09-01 DIAGNOSIS — Z7982 Long term (current) use of aspirin: Secondary | ICD-10-CM | POA: Insufficient documentation

## 2014-09-01 DIAGNOSIS — R42 Dizziness and giddiness: Secondary | ICD-10-CM | POA: Diagnosis not present

## 2014-09-01 DIAGNOSIS — I10 Essential (primary) hypertension: Secondary | ICD-10-CM | POA: Insufficient documentation

## 2014-09-01 DIAGNOSIS — R64 Cachexia: Secondary | ICD-10-CM | POA: Insufficient documentation

## 2014-09-01 DIAGNOSIS — R05 Cough: Secondary | ICD-10-CM | POA: Diagnosis not present

## 2014-09-01 DIAGNOSIS — Z8739 Personal history of other diseases of the musculoskeletal system and connective tissue: Secondary | ICD-10-CM | POA: Diagnosis not present

## 2014-09-01 DIAGNOSIS — Z79899 Other long term (current) drug therapy: Secondary | ICD-10-CM | POA: Diagnosis not present

## 2014-09-01 DIAGNOSIS — Z923 Personal history of irradiation: Secondary | ICD-10-CM | POA: Diagnosis not present

## 2014-09-01 DIAGNOSIS — R059 Cough, unspecified: Secondary | ICD-10-CM

## 2014-09-01 DIAGNOSIS — Z8546 Personal history of malignant neoplasm of prostate: Secondary | ICD-10-CM | POA: Diagnosis not present

## 2014-09-01 DIAGNOSIS — R531 Weakness: Secondary | ICD-10-CM | POA: Diagnosis not present

## 2014-09-01 DIAGNOSIS — Z8639 Personal history of other endocrine, nutritional and metabolic disease: Secondary | ICD-10-CM | POA: Diagnosis not present

## 2014-09-01 DIAGNOSIS — Z791 Long term (current) use of non-steroidal anti-inflammatories (NSAID): Secondary | ICD-10-CM | POA: Insufficient documentation

## 2014-09-01 LAB — COMPREHENSIVE METABOLIC PANEL
ALK PHOS: 62 U/L (ref 39–117)
ALT: 13 U/L (ref 0–53)
ANION GAP: 5 (ref 5–15)
AST: 24 U/L (ref 0–37)
Albumin: 3.3 g/dL — ABNORMAL LOW (ref 3.5–5.2)
BUN: 14 mg/dL (ref 6–23)
CO2: 30 mmol/L (ref 19–32)
CREATININE: 0.88 mg/dL (ref 0.50–1.35)
Calcium: 8.7 mg/dL (ref 8.4–10.5)
Chloride: 101 mmol/L (ref 96–112)
GFR calc non Af Amer: 76 mL/min — ABNORMAL LOW (ref >90)
GFR, EST AFRICAN AMERICAN: 88 mL/min — AB (ref >90)
GLUCOSE: 94 mg/dL (ref 70–99)
Potassium: 4.6 mmol/L (ref 3.5–5.1)
Sodium: 136 mmol/L (ref 135–145)
TOTAL PROTEIN: 6.2 g/dL (ref 6.0–8.3)
Total Bilirubin: 0.7 mg/dL (ref 0.3–1.2)

## 2014-09-01 LAB — CBC WITH DIFFERENTIAL/PLATELET
BASOS ABS: 0 10*3/uL (ref 0.0–0.1)
Basophils Relative: 0 % (ref 0–1)
Eosinophils Absolute: 0.1 10*3/uL (ref 0.0–0.7)
Eosinophils Relative: 2 % (ref 0–5)
HCT: 42.2 % (ref 39.0–52.0)
HEMOGLOBIN: 13.6 g/dL (ref 13.0–17.0)
LYMPHS ABS: 0.6 10*3/uL — AB (ref 0.7–4.0)
Lymphocytes Relative: 9 % — ABNORMAL LOW (ref 12–46)
MCH: 28.6 pg (ref 26.0–34.0)
MCHC: 32.2 g/dL (ref 30.0–36.0)
MCV: 88.8 fL (ref 78.0–100.0)
MONOS PCT: 11 % (ref 3–12)
Monocytes Absolute: 0.7 10*3/uL (ref 0.1–1.0)
NEUTROS ABS: 5.2 10*3/uL (ref 1.7–7.7)
NEUTROS PCT: 78 % — AB (ref 43–77)
Platelets: 259 10*3/uL (ref 150–400)
RBC: 4.75 MIL/uL (ref 4.22–5.81)
RDW: 14 % (ref 11.5–15.5)
WBC: 6.7 10*3/uL (ref 4.0–10.5)

## 2014-09-01 LAB — PSA: PSA: 76.13 ng/mL — ABNORMAL HIGH (ref <=4.00)

## 2014-09-01 LAB — PROCALCITONIN

## 2014-09-01 MED ORDER — VANCOMYCIN HCL IN DEXTROSE 1-5 GM/200ML-% IV SOLN
1000.0000 mg | Freq: Once | INTRAVENOUS | Status: AC
  Administered 2014-09-01: 1000 mg via INTRAVENOUS
  Filled 2014-09-01: qty 200

## 2014-09-01 MED ORDER — DEXTROSE 5 % IV SOLN
2.0000 g | Freq: Once | INTRAVENOUS | Status: AC
  Administered 2014-09-01: 2 g via INTRAVENOUS
  Filled 2014-09-01: qty 2

## 2014-09-01 NOTE — ED Provider Notes (Signed)
CSN: [id number]     Arrival date & time 09/01/14  1018 History  This chart was scribed for [name] Hamburger, MD by Edison Simon, ED Scribe. This patient was seen in room APA14/APA14 and the patient's care was started at 10:51 AM.    Chief Complaint  Patient presents with  . Pneumonia   The history is provided by the patient. No language interpreter was used.    HPI Comments: [name] [name] is a 81 y.o. male who presents to the Emergency Department complaining of cough with onset 1 month ago. He states he was diagnosed with pneumonia by Dr. Jomarie Longs then had 10 days of antibiotics which he is not able to name. He followed up 2 days ago, had chest x-ray, was told his pneumonia had not resolved, and was referred to ED for IV antibiotics.  He states he had SOB which has improved since using antibiotics. He states he has felt better since using the antibiotics. He notes that his breathing "rattles" when he lies down. Patient is currently being treated for prostate cancer which has metastasized. He was told by his doctor to "get his house in order" as he has about 6 months go live. He states he has had weakness and dizziness which he suspects are at least partially due to medications. He denies fever or leg swelling.  PCP: Jerlyn Ly, MD   Past Medical History  Diagnosis Date  . Cancer 2011    prostate w radiation  . Hyperlipemia   . Osteoporosis   . Hypertension    Past Surgical History  Procedure Laterality Date  . Partial gastrectomy  1965  . Transurethral resection of prostate  1995  . Cataract extraction  2007    Bilaterally  . Corneal transplant  2008    Bilaterally   No family history on file. History  Substance Use Topics  . Smoking status: Never Smoker   . Smokeless tobacco: Not on file  . Alcohol Use: No    Review of Systems  Constitutional: Negative for fever.  Respiratory: Positive for cough. Negative for shortness of breath.   Cardiovascular: Negative for chest  pain and leg swelling.  Gastrointestinal: Negative for vomiting.  Neurological: Positive for dizziness and weakness.  All other systems reviewed and are negative.     Allergies  Statins  Home Medications   Prior to Admission medications   Medication Sig Start Date End Date Taking? Authorizing Provider  abiraterone Acetate (ZYTIGA) 250 MG tablet Take 4 tablets (1,000 mg total) by mouth daily. Take on an empty stomach 1 hour before or 2 hours after a meal 08/31/14   Wyatt Portela, MD  Artificial Tear Solution (SOOTHE HYDRATION) 1.25 % SOLN Apply to eye.    Historical Provider, MD  aspirin 81 MG tablet Take 81 mg by mouth daily. Mon, wed, fri    Historical Provider, MD  denosumab (XGEVA) 120 MG/1.7ML SOLN injection Inject into the skin.    Historical Provider, MD  diclofenac sodium (VOLTAREN) 1 % GEL Apply 2 g topically 4 (four) times daily. 07/26/14   Wyatt Portela, MD  Multiple Vitamin (DAILY VITAMIN FORMULA PO) Take by mouth daily. OTC Men's One a Day (10 key nutrients).    Historical Provider, MD  prednisoLONE acetate (PRED FORTE) 1 % ophthalmic suspension Place 1 drop into both eyes Nightly.    Historical Provider, MD  predniSONE (DELTASONE) 5 MG tablet Take one tablet daily with Zytiga. 08/31/14   Wyatt Portela, MD  thiamine (VITAMIN B-1) 50 MG tablet Take 50 mg by mouth daily.    Historical Provider, MD  Vitamin D, Ergocalciferol, (DRISDOL) 50000 UNITS CAPS Take 50,000 Units by mouth every 14 (fourteen) days.    Historical Provider, MD   BP 122/82 mmHg  Pulse 87  Temp(Src) 97.9 F (36.6 C) (Oral)  Resp 20  Ht 5\' 9"  (1.753 m)  Wt 141 lb (63.957 kg)  BMI 20.81 kg/m2  SpO2 100% Physical Exam  Constitutional: He is oriented to person, place, and time. He appears well-developed. He appears cachectic.  HENT:  Head: Normocephalic and atraumatic.  Right Ear: External ear normal.  Left Ear: External ear normal.  Nose: Nose normal.  Mouth/Throat: Oropharynx is clear and moist.   Eyes: Right eye exhibits no discharge. Left eye exhibits no discharge.  Neck: Neck supple.  Cardiovascular: Normal rate, regular rhythm and normal heart sounds.   No murmur heard. Pulmonary/Chest: Effort normal and breath sounds normal. No respiratory distress. He has no wheezes. He has no rales.  Abdominal: Soft. He exhibits no distension. There is no tenderness.  Musculoskeletal: Normal range of motion. He exhibits no edema (no peripheral edema).  Neurological: He is alert and oriented to person, place, and time.  Skin: Skin is warm and dry.  Psychiatric: He has a normal mood and affect.  Nursing note and vitals reviewed.   ED Course  Procedures (including critical care time)  DIAGNOSTIC STUDIES: Oxygen Saturation is 100% on room air, normal by my interpretation.    COORDINATION OF CARE: 11:00 AM Discussed treatment plan with patient at beside, the patient agrees with the plan and has no further questions at this time.   Labs Review Labs Reviewed  COMPREHENSIVE METABOLIC PANEL - Abnormal; Notable for the following:    Albumin 3.3 (*)    GFR calc non Af Amer 76 (*)    GFR calc Af Amer 88 (*)    All other components within normal limits  CBC WITH DIFFERENTIAL/PLATELET - Abnormal; Notable for the following:    Neutrophils Relative % 78 (*)    Lymphocytes Relative 9 (*)    Lymphs Abs 0.6 (*)    All other components within normal limits  CULTURE, BLOOD (ROUTINE X 2)  CULTURE, BLOOD (ROUTINE X 2)  PROCALCITONIN    Imaging Review Dg Chest 2 View  09/01/2014   CLINICAL DATA:  Pneumonia  EXAM: CHEST  2 VIEW  COMPARISON:  08/30/2014  FINDINGS: Patchy bilateral lung opacity in the apical lungs and in the right mid chest, stable from prior. Small bilateral pleural effusions without interval increase. No pulmonary edema. No cardiomegaly or aortic/hilar contour abnormality.  IMPRESSION: Unchanged bilateral pneumonia with small pleural effusions.   Electronically Signed   By: Monte Fantasia M.D.   On: 09/01/2014 12:35     EKG Interpretation None      MDM   Final diagnoses:  Cough  Weakness    Patient completed his 10 day course of levaquin. His cough and dyspnea is improved but given CXR from urgent care showing PNA still present, he was advised to get IV antibiotics. After discussion with hospitalist, patient meets no inpatient criteria and is stable for discharge. He has no increased WOB at rest, no hypoxia at rest or with ambulation, and normal WBC, negative procalcitonin (suggested by Dr. Raliegh Ip, as well as an unchanged Xray. It is likely that his radiographic findings lag behind his clinical course as he did improve. The continued weakness and some dyspnea  is likely more related to his overall conditioning and metastatic cancer. After a long discussion with patient and his good friend at bedside, they agree to discharge and I will consult case management for home health needs. Patient was initially given antibiotics IV but after further review I believe this is all post-pneumonia symptoms and do not feel he needs antibiotics at home.  I personally performed the services described in this documentation, which was scribed in my presence. The recorded information has been reviewed and is accurate.   [name] Hamburger, MD 09/01/14 [phone]

## 2014-09-01 NOTE — ED Notes (Signed)
Patient ambulated to the restroom back to room. Patient states he feels weak. O2 100% while ambulating HR 88.

## 2014-09-01 NOTE — Progress Notes (Signed)
ANTIBIOTIC CONSULT NOTE - INITIAL  Pharmacy Consult for Vancomycin and Cefepime Indication: pneumonia  Allergies  Allergen Reactions  . Statins Other (See Comments)    Muscle weakness in arms and legs    Patient Measurements: Height: 5\' 9"  (175.3 cm) Weight: 141 lb (63.957 kg) IBW/kg (Calculated) : 70.7  Vital Signs: Temp: 97.9 F (36.6 C) (02/18 1030) Temp Source: Oral (02/18 1030) BP: 126/65 mmHg (02/18 1430) Pulse Rate: 87 (02/18 1430) Intake/Output from previous day:   Intake/Output from this shift:    Labs:  Recent Labs  08/31/14 1438 09/01/14 1112  WBC 6.7 6.7  HGB 13.4 13.6  PLT 268 259  CREATININE 0.9 0.88   Estimated Creatinine Clearance: 54.5 mL/min (by C-G formula based on Cr of 0.88). No results for input(s): VANCOTROUGH, VANCOPEAK, VANCORANDOM, GENTTROUGH, GENTPEAK, GENTRANDOM, TOBRATROUGH, TOBRAPEAK, TOBRARND, AMIKACINPEAK, AMIKACINTROU, AMIKACIN in the last 72 hours.   Microbiology: Recent Results (from the past 720 hour(s))  Blood culture (routine x 2)     Status: None (Preliminary result)   Collection Time: 09/01/14 12:59 PM  Result Value Ref Range Status   Specimen Description Blood  Final   Special Requests NONE  Final   Culture NO GROWTH <24 HRS  Final   Report Status PENDING  Incomplete  Blood culture (routine x 2)     Status: None (Preliminary result)   Collection Time: 09/01/14 12:59 PM  Result Value Ref Range Status   Specimen Description Blood  Final   Special Requests NONE  Final   Culture NO GROWTH <24 HRS  Final   Report Status PENDING  Incomplete    Medical History: Past Medical History  Diagnosis Date  . Cancer 2011    prostate w radiation  . Hyperlipemia   . Osteoporosis   . Hypertension    Anti-infectives    Start     Dose/Rate Route Frequency Ordered Stop   09/01/14 1345  vancomycin (VANCOCIN) IVPB 1000 mg/200 mL premix     1,000 mg 200 mL/hr over 60 Minutes Intravenous  Once 09/01/14 1307     09/01/14 1315   ceFEPIme (MAXIPIME) 2 g in dextrose 5 % 50 mL IVPB     2 g 100 mL/hr over 30 Minutes Intravenous  Once 09/01/14 1307 09/01/14 1354     Assessment: 81yo male admitted with pneumonia.  Pt states he was on PO antibiotics for ~ 10 days and returned to MD and was told he still had pneumonia and was instructed to go to ER for IV antibiotics.  Estimated Creatinine Clearance: 54.5 mL/min (by C-G formula based on Cr of 0.88).  Goal of Therapy:  Vancomycin trough level 15-20 mcg/ml Eradicate infection.  Plan:  Cefepime 2gm IV now x 1 then 1gm IV q12hrs Vancomycin 1000mg  IV now x 1 then 500mg  IV q12hrs Check trough at steady state Monitor labs, renal fxn, and cultures (orders for maintenance doses have been signed and held pending bed assignment)  Hart Robinsons A 09/01/2014,3:10 PM

## 2014-09-01 NOTE — Progress Notes (Signed)
Cm called to ED to arrange home health services. When CM inquired about services pt needed, he wants someone to cook and clean for him. CM explained that insurance will not pay for those services but I could send a RN and PT if needed and he said those services would not benefit him. I offered private duty agency list and he refuses. Pt stated that he lives alone and his wife is at the [facility]. Pt stated he has a cane, walker, shower chair in the home. No other Cm needs noted. Pts bedside RN is aware.

## 2014-09-01 NOTE — ED Notes (Signed)
Discharge instructions reviewed with pt, questions answered. Pt verbalized understanding.  

## 2014-09-01 NOTE — ED Notes (Addendum)
Pt reports was diagnosed with pneumonia and was on po antibiotics for approx 10 days.  Says went back to Dr. Jomarie Longs and was told he still had pneumonia and says was told to come to ER for eval and possible IV antibiotics and admission.  Pt reports has been having treatments for his prostate cancer since 2010.  Says saw his oncologist yesterday and was told they were putting him on a new medication and it would give him maybe 6 months to live.  Pt says hasn't started the new medication yet.

## 2014-09-01 NOTE — Progress Notes (Signed)
Per [employer] [id number] tabs has been approved 09/01/14-09/02/15 under the medicare part d for the patient.  PFX#[id number] sending to medical records

## 2014-09-01 NOTE — Discharge Instructions (Signed)
Cough, Adult  A cough is a reflex that helps clear your throat and airways. It can help heal the body or may be a reaction to an irritated airway. A cough may only last 2 or 3 weeks (acute) or may last more than 8 weeks (chronic).  CAUSES Acute cough:  Viral or bacterial infections. Chronic cough:  Infections.  Allergies.  Asthma.  Post-nasal drip.  Smoking.  Heartburn or acid reflux.  Some medicines.  Chronic lung problems (COPD).  Cancer. SYMPTOMS   Cough.  Fever.  Chest pain.  Increased breathing rate.  High-pitched whistling sound when breathing (wheezing).  Colored mucus that you cough up (sputum). TREATMENT   A bacterial cough may be treated with antibiotic medicine.  A viral cough must run its course and will not respond to antibiotics.  Your caregiver may recommend other treatments if you have a chronic cough. HOME CARE INSTRUCTIONS   Only take over-the-counter or prescription medicines for pain, discomfort, or fever as directed by your caregiver. Use cough suppressants only as directed by your caregiver.  Use a cold steam vaporizer or humidifier in your bedroom or home to help loosen secretions.  Sleep in a semi-upright position if your cough is worse at night.  Rest as needed.  Stop smoking if you smoke. SEEK IMMEDIATE MEDICAL CARE IF:   You have pus in your sputum.  Your cough starts to worsen.  You cannot control your cough with suppressants and are losing sleep.  You begin coughing up blood.  You have difficulty breathing.  You develop pain which is getting worse or is uncontrolled with medicine.  You have a fever. MAKE SURE YOU:   Understand these instructions.  Will watch your condition.  Will get help right away if you are not doing well or get worse. Document Released: 12/28/2010 Document Revised: 09/23/2011 Document Reviewed: 12/28/2010 [facility] Patient Information 2015 [place], [place]. This information is not intended  to replace advice given to you by your health care provider. Make sure you discuss any questions you have with your health care provider.    Weakness Weakness is a lack of strength. It may be felt all over the body (generalized) or in one specific part of the body (focal). Some causes of weakness can be serious. You may need further medical evaluation, especially if you are elderly or you have a history of immunosuppression (such as chemotherapy or HIV), kidney disease, heart disease, or diabetes. CAUSES  Weakness can be caused by many different things, including:  Infection.  Physical exhaustion.  Internal bleeding or other blood loss that results in a lack of red blood cells (anemia).  Dehydration. This cause is more common in elderly people.  Side effects or electrolyte abnormalities from medicines, such as pain medicines or sedatives.  Emotional distress, anxiety, or depression.  Circulation problems, especially severe peripheral arterial disease.  Heart disease, such as rapid atrial fibrillation, bradycardia, or heart failure.  Nervous system disorders, such as Guillain-Barr syndrome, multiple sclerosis, or stroke. DIAGNOSIS  To find the cause of your weakness, your caregiver will take your history and perform a physical exam. Lab tests or X-rays may also be ordered, if needed. TREATMENT  Treatment of weakness depends on the cause of your symptoms and can vary greatly. HOME CARE INSTRUCTIONS   Rest as needed.  Eat a well-balanced diet.  Try to get some exercise every day.  Only take over-the-counter or prescription medicines as directed by your caregiver. SEEK MEDICAL CARE IF:   Your  weakness seems to be getting worse or spreads to other parts of your body.  You develop new aches or pains. SEEK IMMEDIATE MEDICAL CARE IF:   You cannot perform your normal daily activities, such as getting dressed and feeding yourself.  You cannot walk up and down stairs, or you  feel exhausted when you do so.  You have shortness of breath or chest pain.  You have difficulty moving parts of your body.  You have weakness in only one area of the body or on only one side of the body.  You have a fever.  You have trouble speaking or swallowing.  You cannot control your bladder or bowel movements.  You have black or bloody vomit or stools. MAKE SURE YOU:  Understand these instructions.  Will watch your condition.  Will get help right away if you are not doing well or get worse. Document Released: 07/01/2005 Document Revised: 12/31/2011 Document Reviewed: 08/30/2011 [facility] Patient Information 2015 [place], [place]. This information is not intended to replace advice given to you by your health care provider. Make sure you discuss any questions you have with your health care provider.

## 2014-09-01 NOTE — ED Notes (Signed)
Pt awaiting face to face consult with social worker for home health set up.

## 2014-09-02 ENCOUNTER — Other Ambulatory Visit: Payer: Self-pay | Admitting: *Deleted

## 2014-09-02 DIAGNOSIS — C61 Malignant neoplasm of prostate: Secondary | ICD-10-CM

## 2014-09-02 MED ORDER — PREDNISONE 5 MG PO TABS: ORAL_TABLET | ORAL | Status: DC

## 2014-09-02 MED ORDER — ABIRATERONE ACETATE 250 MG PO TABS: 1000.0000 mg | ORAL_TABLET | Freq: Every day | ORAL | Status: DC

## 2014-09-06 ENCOUNTER — Other Ambulatory Visit: Payer: Self-pay | Admitting: Urology

## 2014-09-06 ENCOUNTER — Ambulatory Visit (INDEPENDENT_AMBULATORY_CARE_PROVIDER_SITE_OTHER): Payer: Medicare Other | Admitting: Urology

## 2014-09-06 ENCOUNTER — Encounter: Payer: Self-pay | Admitting: *Deleted

## 2014-09-06 DIAGNOSIS — C61 Malignant neoplasm of prostate: Secondary | ICD-10-CM

## 2014-09-06 LAB — CULTURE, BLOOD (ROUTINE X 2)
CULTURE: NO GROWTH
Culture: NO GROWTH

## 2014-09-06 NOTE — Progress Notes (Signed)
09/06/14 @ 9:10 am:  [name] [name] called with concerns about his Zytiga prescription.  He believed that the drug was to come from [facility], but he received a call from [facility] requesting his credit card number for co-payment, which he gave them.  He received by mail yesterday a supply of ibuprofen only, no Zytiga.  He is concerned that he has been "scammed."  The prescription was faxed to [facility], so I suggested he call [facility], with whom he has a long standing relationship, to ask what has occurred with the [facility] prescription.  If he is still unclear, he agreed to call me back for further investigation.  09/06/14 @ 9:25 am:  [name] [name] called back to say that he contacted [facility] and they do have the prescription and that they will mail it out tomorrow. [facility] called to verify that he should receive the Zytiga.  That was confirmed.

## 2014-09-08 ENCOUNTER — Telehealth: Payer: Self-pay

## 2014-09-08 ENCOUNTER — Ambulatory Visit (HOSPITAL_COMMUNITY)
Admission: RE | Admit: 2014-09-08 | Discharge: 2014-09-08 | Disposition: A | Discharge status: Home / Self Care | Payer: Medicare Other | Source: Ambulatory Visit | Attending: Diagnostic Radiology | Admitting: Diagnostic Radiology

## 2014-09-08 ENCOUNTER — Encounter (HOSPITAL_COMMUNITY): Payer: Self-pay

## 2014-09-08 ENCOUNTER — Ambulatory Visit (HOSPITAL_COMMUNITY)
Admission: RE | Admit: 2014-09-08 | Discharge: 2014-09-08 | Disposition: A | Discharge status: Home / Self Care | Payer: Medicare Other | Source: Ambulatory Visit | Attending: Urology | Admitting: Urology

## 2014-09-08 DIAGNOSIS — H539 Unspecified visual disturbance: Secondary | ICD-10-CM | POA: Insufficient documentation

## 2014-09-08 DIAGNOSIS — R0601 Orthopnea: Secondary | ICD-10-CM | POA: Diagnosis not present

## 2014-09-08 DIAGNOSIS — I509 Heart failure, unspecified: Secondary | ICD-10-CM | POA: Insufficient documentation

## 2014-09-08 DIAGNOSIS — Z9889 Other specified postprocedural states: Secondary | ICD-10-CM

## 2014-09-08 DIAGNOSIS — J90 Pleural effusion, not elsewhere classified: Secondary | ICD-10-CM | POA: Insufficient documentation

## 2014-09-08 DIAGNOSIS — Z8546 Personal history of malignant neoplasm of prostate: Secondary | ICD-10-CM | POA: Insufficient documentation

## 2014-09-08 DIAGNOSIS — C61 Malignant neoplasm of prostate: Secondary | ICD-10-CM

## 2014-09-08 NOTE — Telephone Encounter (Signed)
[facility] communication about pharmacy drug care team for the pts zytiga

## 2014-09-08 NOTE — Progress Notes (Signed)
Patient tolerated thoracentesis by Dr Thornton Papas. Tolerated procedure well and without complications. 1400 mls drained, yellow in color.

## 2014-09-08 NOTE — Procedures (Signed)
PreOperative Dx: BILATERAL pleural effusion Postoperative Dx: BILATERAL pleural effusion Procedure:   US guided RIGHT thoracentesis Radiologist:  Thornton Papas Anesthesia:  6 ml of 1 lidocaine Specimen:  1400 ml of clear yellow colored fluid EBL:   < 1 ml Complications: None

## 2014-09-08 NOTE — Sedation Documentation (Signed)
Patient tolerating procedure well. Oxygen sats remain 100% on RA. Patient alert/oriented x 4.

## 2014-09-13 ENCOUNTER — Other Ambulatory Visit (HOSPITAL_COMMUNITY): Payer: Self-pay | Admitting: Internal Medicine

## 2014-09-13 DIAGNOSIS — J90 Pleural effusion, not elsewhere classified: Secondary | ICD-10-CM

## 2014-09-16 ENCOUNTER — Ambulatory Visit (HOSPITAL_COMMUNITY)
Admission: RE | Admit: 2014-09-16 | Discharge: 2014-09-16 | Disposition: A | Discharge status: Home / Self Care | Payer: Medicare Other | Source: Ambulatory Visit | Attending: Radiology | Admitting: Radiology

## 2014-09-16 ENCOUNTER — Ambulatory Visit (HOSPITAL_COMMUNITY)
Admission: RE | Admit: 2014-09-16 | Discharge: 2014-09-16 | Disposition: A | Discharge status: Home / Self Care | Payer: Medicare Other | Source: Ambulatory Visit | Attending: Internal Medicine | Admitting: Internal Medicine

## 2014-09-16 DIAGNOSIS — J90 Pleural effusion, not elsewhere classified: Secondary | ICD-10-CM | POA: Diagnosis not present

## 2014-09-16 DIAGNOSIS — Z9889 Other specified postprocedural states: Secondary | ICD-10-CM

## 2014-09-16 LAB — BODY FLUID CELL COUNT WITH DIFFERENTIAL
LYMPHS FL: 69 %
MONOCYTE-MACROPHAGE-SEROUS FLUID: 21 % — AB (ref 50–90)
NEUTROPHIL FLUID: 10 % (ref 0–25)
Total Nucleated Cell Count, Fluid: 578 cu mm (ref 0–1000)

## 2014-09-16 LAB — LACTATE DEHYDROGENASE, PLEURAL OR PERITONEAL FLUID: LD FL: 52 U/L — AB (ref 3–23)

## 2014-09-16 LAB — ALBUMIN, FLUID (OTHER): Albumin, Fluid: 1.1 g/dL

## 2014-09-16 NOTE — Procedures (Signed)
US guided diagnostic/therapeutic right thoracentesis performed yielding 1.7 liters slightly turbid, yellow fluid. F/u CXR pending. No immediate complications.

## 2014-09-19 LAB — BODY FLUID CULTURE
Culture: NO GROWTH
Gram Stain: NONE SEEN

## 2014-09-23 ENCOUNTER — Telehealth: Payer: Self-pay | Admitting: Nutrition

## 2014-09-23 ENCOUNTER — Ambulatory Visit (INDEPENDENT_AMBULATORY_CARE_PROVIDER_SITE_OTHER): Payer: Medicare Other | Admitting: Urology

## 2014-09-23 DIAGNOSIS — C61 Malignant neoplasm of prostate: Secondary | ICD-10-CM

## 2014-09-23 NOTE — Telephone Encounter (Signed)
Patient was identified to be at risk for malnutrition on the MST secondary to poor appetite and weight loss. Contacted patient by phone. He was unavailable.  Left message to return my call.

## 2014-09-28 ENCOUNTER — Encounter (HOSPITAL_COMMUNITY): Payer: Self-pay | Admitting: Hematology & Oncology

## 2014-09-28 ENCOUNTER — Encounter (HOSPITAL_COMMUNITY): Payer: Medicare Other | Attending: Hematology & Oncology | Admitting: Hematology & Oncology

## 2014-09-28 VITALS — BP 118/57 | HR 95 | Temp 98.3°F | Resp 18 | Wt 140.0 lb

## 2014-09-28 DIAGNOSIS — R05 Cough: Secondary | ICD-10-CM

## 2014-09-28 DIAGNOSIS — R059 Cough, unspecified: Secondary | ICD-10-CM

## 2014-09-28 DIAGNOSIS — R630 Anorexia: Secondary | ICD-10-CM

## 2014-09-28 DIAGNOSIS — R634 Abnormal weight loss: Secondary | ICD-10-CM | POA: Diagnosis not present

## 2014-09-28 DIAGNOSIS — C61 Malignant neoplasm of prostate: Secondary | ICD-10-CM | POA: Diagnosis present

## 2014-09-28 DIAGNOSIS — R110 Nausea: Secondary | ICD-10-CM

## 2014-09-28 DIAGNOSIS — R0902 Hypoxemia: Secondary | ICD-10-CM

## 2014-09-28 DIAGNOSIS — B029 Zoster without complications: Secondary | ICD-10-CM

## 2014-09-28 MED ORDER — OMEPRAZOLE 40 MG PO CPDR: 40.0000 mg | DELAYED_RELEASE_CAPSULE | Freq: Every day | ORAL | Status: AC

## 2014-09-28 MED ORDER — VALACYCLOVIR HCL 1 G PO TABS: ORAL_TABLET | ORAL | Status: AC

## 2014-09-28 NOTE — Patient Instructions (Addendum)
[place] [facility] Discharge Instructions  RECOMMENDATIONS MADE BY THE CONSULTANT AND ANY TEST RESULTS WILL BE SENT TO YOUR REFERRING PHYSICIAN.  SPECIAL INSTRUCTIONS/FOLLOW-UP:  You have shingles, I have called in Valtrex for you to [facility] in [place]. Please take one tablet three times daily until gone I have called you in omeprazole to see if this will help with your ongoing nausea. Please try it daily for at least a week to see if it helps Continue your Zytiga and prednisone.  We have written a prescription for oxygen.  When you walk your oxygen levels become very low. Please wear it routinely with exertion.   Please call prior to your next visit with any problems or concerns. We will see you back again in two weeks with labs and an office visit.   Thank you for choosing [facility] to provide your oncology and hematology care.  To afford each patient quality time with our providers, please arrive at least 15 minutes before your scheduled appointment time.  With your help, our goal is to use those 15 minutes to complete the necessary work-up to ensure our physicians have the information they need to help with your evaluation and healthcare recommendations.    Effective January 1st, 2014, we ask that you re-schedule your appointment with our physicians should you arrive 10 or more minutes late for your appointment.  We strive to give you quality time with our providers, and arriving late affects you and other patients whose appointments are after yours.    Again, thank you for choosing [facility].  Our hope is that these requests will decrease the amount of time that you wait before being seen by our physicians.       _____________________________________________________________  Should you have questions after your visit to [facility], please contact our office at [phone] between the hours of 8:30 a.m. and 5:00 p.m.  Voicemails left  after 4:30 p.m. will not be returned until the following business day.  For prescription refill requests, have your pharmacy contact our office with your prescription refill request.      Shingles Shingles is caused by the same virus that causes chickenpox. The first feelings may be pain or tingling. A rash will follow in a couple days. The rash may occur on any area of the body. Long-lasting pain is more likely in an elderly person. It can last months to years. There are medicines that can help prevent pain if you start taking them early. HOME CARE   Take cool baths or place cool cloths on the rash as told by your doctor.  Take medicine only as told by your doctor.  Rest as told by your doctor.  Keep your rash clean with mild soap and cool water or as told by your doctor.  Do not scratch your rash. You may use calamine lotion to relieve itchy skin as told by your doctor.  Keep your rash covered with a loose bandage (dressing).  Avoid touching:  Babies.  Pregnant women.  Children with inflamed skin (eczema).  People who have gotten organ transplants.  People with chronic illnesses, such as leukemia or AIDS.  Wear loose-fitting clothing.  If the rash is on the face, you may need to see a specialist. Keep all appointments. Shingles must be kept away from the eyes, if possible.  Keep all follow-up visits as told by your doctor. GET HELP RIGHT AWAY IF:   You have  any pain on the face or eye.  You lose feeling on one side of your face.  You have ear pain or ringing in your ear.  You cannot taste as well.  Your medicines do not help the pain.  Your redness or puffiness (swelling) spreads.  You feel like you are getting worse.  You have a fever. MAKE SURE YOU:   Understand these instructions.  Will watch your condition.  Will get help right away if you are not doing well or get worse. Document Released: 12/18/2007 Document Revised: 11/15/2013 Document Reviewed:  12/18/2007 [facility] Patient Information 2015 [place], [place]. This information is not intended to replace advice given to you by your health care provider. Make sure you discuss any questions you have with your health care provider.

## 2014-09-30 ENCOUNTER — Other Ambulatory Visit: Payer: Self-pay | Admitting: *Deleted

## 2014-09-30 ENCOUNTER — Encounter: Payer: Self-pay | Admitting: Dietician

## 2014-09-30 DIAGNOSIS — C61 Malignant neoplasm of prostate: Secondary | ICD-10-CM

## 2014-09-30 MED ORDER — ABIRATERONE ACETATE 250 MG PO TABS: 1000.0000 mg | ORAL_TABLET | Freq: Every day | ORAL | Status: DC

## 2014-09-30 NOTE — Progress Notes (Signed)
Patient identified to be at risk for malnutrition on the MST secondary to Wt loss and poor appetite  Contacted Pt by Phone  Wt Readings from Last 10 Encounters:  09/28/14 140 lb (63.504 kg)  09/01/14 141 lb (63.957 kg)  08/31/14 141 lb 3.2 oz (64.048 kg)  07/26/14 146 lb 9.6 oz (66.497 kg)  06/24/14 147 lb 9.6 oz (66.951 kg)  05/25/14 150 lb 6.4 oz (68.221 kg)  04/27/14 148 lb 4.8 oz (67.268 kg)  03/17/14 150 lb (68.04 kg)  02/09/14 153 lb 4.8 oz (69.536 kg)  01/12/14 153 lb 1.6 oz (69.446 kg)   Patient weight has Decreased by about 8 lbs in 3 months  Patient reports oral intake as poor-fair and is suffering from symptoms including nausea/vomiting and loss of appetite  Pt says recently he has had trouble keeping food down. He says he has already tried eating smaller meals as well as staying away from spices, but it has not helped. He recently obtained anti-nausea medication from doctor, but also has not helped. Discussed with pt some foods that may not upset his nausea i.e. White rice, sherbet, juice, ginger ale.   Also talked about how the pt that he does eat should be high in protein/calories to make up for his lack of intake. He states he knows this and has been eating foods like cottage cheese and protein drinks. He said he tried choc boost/ensure and it gave him diarrhea. Recommend trying vanilla boost/ensure since I have coupons for those available.   Pt seemed to be open to my recommendations.   Mailed my contact info, coupons, and handouts titled "Nausea and Vomiting" and "Increasing calories and protein".   Burtis Junes RD, LDN Nutrition Pager: [phone] 09/30/2014 10:54 AM

## 2014-10-01 ENCOUNTER — Other Ambulatory Visit: Payer: Self-pay

## 2014-10-01 ENCOUNTER — Inpatient Hospital Stay (HOSPITAL_COMMUNITY)
Admission: EM | Admit: 2014-10-01 | Discharge: 2014-10-03 | DRG: 871 | Disposition: A | Discharge status: Hospice | Payer: Medicare Other | Attending: Family Medicine | Admitting: Family Medicine

## 2014-10-01 ENCOUNTER — Encounter (HOSPITAL_COMMUNITY): Payer: Self-pay | Admitting: *Deleted

## 2014-10-01 ENCOUNTER — Emergency Department (HOSPITAL_COMMUNITY): Payer: Medicare Other

## 2014-10-01 DIAGNOSIS — Z7952 Long term (current) use of systemic steroids: Secondary | ICD-10-CM

## 2014-10-01 DIAGNOSIS — I248 Other forms of acute ischemic heart disease: Secondary | ICD-10-CM | POA: Diagnosis present

## 2014-10-01 DIAGNOSIS — I509 Heart failure, unspecified: Secondary | ICD-10-CM | POA: Diagnosis present

## 2014-10-01 DIAGNOSIS — Z923 Personal history of irradiation: Secondary | ICD-10-CM

## 2014-10-01 DIAGNOSIS — Z515 Encounter for palliative care: Secondary | ICD-10-CM | POA: Diagnosis not present

## 2014-10-01 DIAGNOSIS — I10 Essential (primary) hypertension: Secondary | ICD-10-CM | POA: Diagnosis present

## 2014-10-01 DIAGNOSIS — Z66 Do not resuscitate: Secondary | ICD-10-CM | POA: Diagnosis present

## 2014-10-01 DIAGNOSIS — J9601 Acute respiratory failure with hypoxia: Secondary | ICD-10-CM | POA: Diagnosis present

## 2014-10-01 DIAGNOSIS — G9340 Encephalopathy, unspecified: Secondary | ICD-10-CM | POA: Diagnosis present

## 2014-10-01 DIAGNOSIS — T380X5A Adverse effect of glucocorticoids and synthetic analogues, initial encounter: Secondary | ICD-10-CM | POA: Diagnosis present

## 2014-10-01 DIAGNOSIS — Z7982 Long term (current) use of aspirin: Secondary | ICD-10-CM | POA: Diagnosis not present

## 2014-10-01 DIAGNOSIS — Z903 Acquired absence of stomach [part of]: Secondary | ICD-10-CM | POA: Diagnosis present

## 2014-10-01 DIAGNOSIS — Z947 Corneal transplant status: Secondary | ICD-10-CM

## 2014-10-01 DIAGNOSIS — C61 Malignant neoplasm of prostate: Secondary | ICD-10-CM | POA: Diagnosis not present

## 2014-10-01 DIAGNOSIS — Y95 Nosocomial condition: Secondary | ICD-10-CM | POA: Diagnosis present

## 2014-10-01 DIAGNOSIS — A419 Sepsis, unspecified organism: Principal | ICD-10-CM

## 2014-10-01 DIAGNOSIS — E860 Dehydration: Secondary | ICD-10-CM | POA: Diagnosis present

## 2014-10-01 DIAGNOSIS — E871 Hypo-osmolality and hyponatremia: Secondary | ICD-10-CM | POA: Diagnosis present

## 2014-10-01 DIAGNOSIS — J189 Pneumonia, unspecified organism: Secondary | ICD-10-CM | POA: Diagnosis present

## 2014-10-01 DIAGNOSIS — R739 Hyperglycemia, unspecified: Secondary | ICD-10-CM | POA: Diagnosis present

## 2014-10-01 DIAGNOSIS — C7951 Secondary malignant neoplasm of bone: Secondary | ICD-10-CM | POA: Diagnosis present

## 2014-10-01 DIAGNOSIS — E785 Hyperlipidemia, unspecified: Secondary | ICD-10-CM | POA: Diagnosis present

## 2014-10-01 DIAGNOSIS — M810 Age-related osteoporosis without current pathological fracture: Secondary | ICD-10-CM | POA: Diagnosis present

## 2014-10-01 DIAGNOSIS — E878 Other disorders of electrolyte and fluid balance, not elsewhere classified: Secondary | ICD-10-CM | POA: Diagnosis present

## 2014-10-01 DIAGNOSIS — F419 Anxiety disorder, unspecified: Secondary | ICD-10-CM | POA: Diagnosis present

## 2014-10-01 DIAGNOSIS — R0602 Shortness of breath: Secondary | ICD-10-CM

## 2014-10-01 DIAGNOSIS — B029 Zoster without complications: Secondary | ICD-10-CM | POA: Diagnosis present

## 2014-10-01 LAB — COMPREHENSIVE METABOLIC PANEL
ALK PHOS: 156 U/L — AB (ref 39–117)
ALT: 27 U/L (ref 0–53)
AST: 66 U/L — ABNORMAL HIGH (ref 0–37)
Albumin: 3.3 g/dL — ABNORMAL LOW (ref 3.5–5.2)
Anion gap: 10 (ref 5–15)
BILIRUBIN TOTAL: 0.6 mg/dL (ref 0.3–1.2)
BUN: 17 mg/dL (ref 6–23)
CO2: 27 mmol/L (ref 19–32)
CREATININE: 1.25 mg/dL (ref 0.50–1.35)
Calcium: 8.4 mg/dL (ref 8.4–10.5)
Chloride: 93 mmol/L — ABNORMAL LOW (ref 96–112)
GFR calc Af Amer: 58 mL/min — ABNORMAL LOW (ref >90)
GFR calc non Af Amer: 50 mL/min — ABNORMAL LOW (ref >90)
Glucose, Bld: 440 mg/dL — ABNORMAL HIGH (ref 70–99)
Potassium: 4 mmol/L (ref 3.5–5.1)
SODIUM: 130 mmol/L — AB (ref 135–145)
Total Protein: 6.8 g/dL (ref 6.0–8.3)

## 2014-10-01 LAB — CBC WITH DIFFERENTIAL/PLATELET
BASOS ABS: 0 10*3/uL (ref 0.0–0.1)
Basophils Relative: 0 % (ref 0–1)
EOS ABS: 0.1 10*3/uL (ref 0.0–0.7)
EOS PCT: 1 % (ref 0–5)
HCT: 44.6 % (ref 39.0–52.0)
Hemoglobin: 14.3 g/dL (ref 13.0–17.0)
LYMPHS PCT: 6 % — AB (ref 12–46)
Lymphs Abs: 0.8 10*3/uL (ref 0.7–4.0)
MCH: 29.1 pg (ref 26.0–34.0)
MCHC: 32.1 g/dL (ref 30.0–36.0)
MCV: 90.8 fL (ref 78.0–100.0)
MONOS PCT: 6 % (ref 3–12)
Monocytes Absolute: 0.7 10*3/uL (ref 0.1–1.0)
NEUTROS ABS: 11 10*3/uL — AB (ref 1.7–7.7)
Neutrophils Relative %: 87 % — ABNORMAL HIGH (ref 43–77)
PLATELETS: 418 10*3/uL — AB (ref 150–400)
RBC: 4.91 MIL/uL (ref 4.22–5.81)
RDW: 14.1 % (ref 11.5–15.5)
WBC: 12.5 10*3/uL — ABNORMAL HIGH (ref 4.0–10.5)

## 2014-10-01 LAB — URINALYSIS, ROUTINE W REFLEX MICROSCOPIC
Bilirubin Urine: NEGATIVE
Glucose, UA: 100 mg/dL — AB
Ketones, ur: NEGATIVE mg/dL
Leukocytes, UA: NEGATIVE
Nitrite: NEGATIVE
PH: 6 (ref 5.0–8.0)
Protein, ur: NEGATIVE mg/dL
Specific Gravity, Urine: 1.015 (ref 1.005–1.030)
Urobilinogen, UA: 1 mg/dL (ref 0.0–1.0)

## 2014-10-01 LAB — URINE MICROSCOPIC-ADD ON

## 2014-10-01 LAB — BRAIN NATRIURETIC PEPTIDE: B Natriuretic Peptide: 1751 pg/mL — ABNORMAL HIGH (ref 0.0–100.0)

## 2014-10-01 LAB — I-STAT CG4 LACTIC ACID, ED: Lactic Acid, Venous: 2.46 mmol/L (ref 0.5–2.0)

## 2014-10-01 LAB — TROPONIN I: Troponin I: 0.08 ng/mL — ABNORMAL HIGH (ref <0.031)

## 2014-10-01 MED ORDER — DEXTROSE 5 % IV SOLN
2.0000 g | Freq: Once | INTRAVENOUS | Status: AC
  Administered 2014-10-01: 2 g via INTRAVENOUS
  Filled 2014-10-01: qty 2

## 2014-10-01 MED ORDER — VANCOMYCIN HCL IN DEXTROSE 1-5 GM/200ML-% IV SOLN
1000.0000 mg | Freq: Once | INTRAVENOUS | Status: AC
  Administered 2014-10-01: 1000 mg via INTRAVENOUS
  Filled 2014-10-01: qty 200

## 2014-10-01 MED ORDER — FUROSEMIDE 10 MG/ML IJ SOLN
80.0000 mg | Freq: Once | INTRAMUSCULAR | Status: AC
  Administered 2014-10-01: 80 mg via INTRAVENOUS
  Filled 2014-10-01: qty 8

## 2014-10-01 NOTE — ED Notes (Signed)
Pt awake & pulling at mask. Family at bedside. EDP notified.

## 2014-10-01 NOTE — ED Provider Notes (Addendum)
CSN: [id number]     Arrival date & time 10/01/14  1958 History   First MD Initiated Contact with Patient 10/01/14 1958     Chief Complaint  Patient presents with  . Altered Mental Status     (Consider location/radiation/quality/duration/timing/severity/associated sxs/prior Treatment) HPI Comments: Patient brought to the emergency department by ambulance from home. Family reports that he had been doing fairly well earlier today. He has been ambulatory and ate dinner. Just before arrival to the ER, however, patient suddenly became weak and then became unresponsive. EMS was called to the house. They found him with minimal response to painful stimuli. Patient has a DO NOT RESUSCITATE. Patient was brought to the ER with respirations assisted by bag valve mask. Level V Caveat due to acuity of condition.  Patient is a 81 y.o. male presenting with altered mental status.  Altered Mental Status   Past Medical History  Diagnosis Date  . Cancer 2011    prostate w radiation  . Hyperlipemia   . Osteoporosis   . Hypertension    Past Surgical History  Procedure Laterality Date  . Partial gastrectomy  1965  . Transurethral resection of prostate  1995  . Cataract extraction  2007    Bilaterally  . Corneal transplant  2008    Bilaterally   No family history on file. History  Substance Use Topics  . Smoking status: Never Smoker   . Smokeless tobacco: Not on file  . Alcohol Use: No    Review of Systems  Unable to perform ROS: Acuity of condition      Allergies  Statins  Home Medications   Prior to Admission medications   Medication Sig Start Date End Date Taking? Authorizing Provider  abiraterone Acetate (ZYTIGA) 250 MG tablet Take 4 tablets (1,000 mg total) by mouth daily. Take on an empty stomach 1 hour before or 2 hours after a meal 09/30/14  Yes Wyatt Portela, MD  furosemide (LASIX) 20 MG tablet Take 20 mg by mouth daily. 09/14/14  Yes Historical Provider, MD  KLOR-CON 10 10 MEQ  tablet Take 10 mEq by mouth daily. 09/14/14  Yes Historical Provider, MD  predniSONE (DELTASONE) 5 MG tablet Take one tablet daily with Zytiga. 09/02/14  Yes Wyatt Portela, MD  valACYclovir (VALTREX) 1000 MG tablet Take one tablet by mouth three times daily for 7 days 09/28/14  Yes Patrici Ranks, MD  Artificial Tear Solution (SOOTHE HYDRATION) 1.25 % SOLN Apply 1 drop to eye daily as needed (dry eyes).     Historical Provider, MD  Ascorbic Acid (VITAMIN C) 1000 MG tablet Take 1,000 mg by mouth daily.    Historical Provider, MD  aspirin 81 MG tablet Take 81 mg by mouth daily. Mon, wed, fri    Historical Provider, MD  Calcium 500 MG CHEW Chew 1 tablet by mouth 3 (three) times daily.    Historical Provider, MD  denosumab (XGEVA) 120 MG/1.7ML SOLN injection Inject into the skin.    Historical Provider, MD  diclofenac sodium (VOLTAREN) 1 % GEL Apply 2 g topically 4 (four) times daily. 07/26/14   Wyatt Portela, MD  fluconazole (DIFLUCAN) 100 MG tablet  06/24/14   Historical Provider, MD  Multiple Vitamin (DAILY VITAMIN FORMULA PO) Take by mouth daily. OTC Men's One a Day (10 key nutrients).    Historical Provider, MD  omeprazole (PRILOSEC) 40 MG capsule Take 1 capsule (40 mg total) by mouth daily. 09/28/14   Patrici Ranks, MD  ondansetron (  ZOFRAN-ODT) 4 MG disintegrating tablet  09/23/14   Historical Provider, MD  prednisoLONE acetate (PRED FORTE) 1 % ophthalmic suspension Place 1 drop into both eyes Nightly.    Historical Provider, MD  thiamine (VITAMIN B-1) 50 MG tablet Take 50 mg by mouth daily.    Historical Provider, MD  Vitamin D, Ergocalciferol, (DRISDOL) 50000 UNITS CAPS Take 50,000 Units by mouth every 14 (fourteen) days. On Thursday.    Historical Provider, MD   BP 126/61 mmHg  Pulse 118  Temp(Src) 97 F (36.1 C) (Core (Comment))  Resp 27  Wt 140 lb (63.504 kg)  SpO2 96% Physical Exam  Constitutional: He appears lethargic.  Thin   HENT:  Head: Normocephalic and atraumatic.  Eyes:  Pupils are equal, round, and reactive to light.  Neck: Neck supple.  Cardiovascular: Regular rhythm and intact distal pulses.  Tachycardia present.   Pulmonary/Chest: Accessory muscle usage present. Tachypnea noted. He has rales.  Abdominal: Soft.  Genitourinary: Penis normal.  Musculoskeletal: He exhibits edema (2+ bilateral lower).  Neurological: He appears lethargic. GCS eye subscore is 1. GCS verbal subscore is 1. GCS motor subscore is 4.    ED Course  Procedures (including critical care time) Labs Review Labs Reviewed  CBC WITH DIFFERENTIAL/PLATELET - Abnormal; Notable for the following:    WBC 12.5 (*)    Platelets 418 (*)    Neutrophils Relative % 87 (*)    Neutro Abs 11.0 (*)    Lymphocytes Relative 6 (*)    All other components within normal limits  COMPREHENSIVE METABOLIC PANEL - Abnormal; Notable for the following:    Sodium 130 (*)    Chloride 93 (*)    Glucose, Bld 440 (*)    Albumin 3.3 (*)    AST 66 (*)    Alkaline Phosphatase 156 (*)    GFR calc non Af Amer 50 (*)    GFR calc Af Amer 58 (*)    All other components within normal limits  TROPONIN I - Abnormal; Notable for the following:    Troponin I 0.08 (*)    All other components within normal limits  BRAIN NATRIURETIC PEPTIDE - Abnormal; Notable for the following:    B Natriuretic Peptide 1751.0 (*)    All other components within normal limits  I-STAT CG4 LACTIC ACID, ED - Abnormal; Notable for the following:    Lactic Acid, Venous 2.46 (*)    All other components within normal limits  CULTURE, BLOOD (ROUTINE X 2)  CULTURE, BLOOD (ROUTINE X 2)  URINE CULTURE  URINALYSIS, ROUTINE W REFLEX MICROSCOPIC    Imaging Review Dg Chest Port 1 View  10/01/2014   CLINICAL DATA:  Unresponsive, shortness of breath  EXAM: PORTABLE CHEST - 1 VIEW  COMPARISON:  09/16/2014  FINDINGS: Multifocal patchy opacities in the left upper lobe and throughout the right lung, suspicious for multifocal pneumonia, with possible  superimposed interstitial edema.  Layering right pleural effusion. Suspected small left pleural effusion.  No pneumothorax. Suspected skin fold along the right lateral hemithorax.  The heart is top-normal in size.  IMPRESSION: Multifocal patchy opacities in the left upper lobe and throughout the right lung, progressed from prior chest radiograph, suspicious for multifocal pneumonia.  Possible superimposed mild interstitial edema.  Layering right pleural effusion. Suspected small left pleural effusion.   Electronically Signed   By: Julian Hy M.D.   On: 10/01/2014 20:44     EKG Interpretation   Date/Time:  Saturday October 01 2014 20:22:47 EDT Ventricular  Rate:  124 PR Interval:  137 QRS Duration: 135 QT Interval:  306 QTC Calculation: 439 R Axis:   99 Text Interpretation:  Sinus tachycardia Ventricular bigeminy Probable left  atrial enlargement RBBB and LPFB Repol abnrm suggests ischemia, diffuse  leads Borderline ST elevation, anterior leads No previous tracing  Confirmed by Olivier Frayre  MD, Macen Joslin ([id number]) on 10/01/2014 9:11:24 PM      MDM   Final diagnoses:  Shortness of breath  HCAP (healthcare-associated pneumonia)  Acute respiratory failure with hypoxia  Hyperglycemia    Patient presents to the ER for evaluation of unresponsiveness. Patient is brought to the ER by ambulance. He is in respiratory distress at arrival. Patient is tachypnea with shallow respirations. He had been assisted with bag valve mask during transport. I did confirm with the patient's wife that he is a DO NOT RESUSCITATE. He does not want CPR or intubation. Patient was therefore initiated on BiPAP at arrival and is tolerating it. Oxygenation is improving.  Patient had significant rales throughout both lung fields upon arrival. He has significant lower extremity edema as well. Patient missed her Lasix based on this examination. X-ray shows multifocal patchy opacities in both lungs consistent with pneumonia  as well as interstitial edema. Patient initiated on cefepime and vancomycin for treatment of healthcare associated pneumonia.  Patient will require hospitalization for further management.  CRITICAL CARE Performed by: Orpah Greek   Total critical care time: 26min  Critical care time was exclusive of separately billable procedures and treating other patients.  Critical care was necessary to treat or prevent imminent or life-threatening deterioration.  Critical care was time spent personally by me on the following activities: development of treatment plan with patient and/or surrogate as well as nursing, discussions with consultants, evaluation of patient's response to treatment, examination of patient, obtaining history from patient or surrogate, ordering and performing treatments and interventions, ordering and review of laboratory studies, ordering and review of radiographic studies, pulse oximetry and re-evaluation of patient's condition.     Orpah Greek, MD 10/01/14 2111  Orpah Greek, MD 10/01/14 2230

## 2014-10-01 NOTE — ED Notes (Signed)
EMS arrived states pt was found unresponsive. Pt has a copy of a DNR no original & a copy of a living will. Pt had nasal trumpet placed & EMS assisted w/ respirations by [facility].

## 2014-10-01 NOTE — H&P (Signed)
Patient's PCP: Jerlyn Ly, MD  Patient's oncologist: Dr. Whitney Muse  Chief Complaint: Shortness of breath  History of Present Illness: [name] [name] is a 81 y.o. Caucasian male with history of metastatic prostate cancer status post radiation with metastatic disease to the bone, hypertension, hyperlipidemia, and osteoporosis who presents with the above complaints.  Patient initially was diagnosed with prostate cancer in 2010 he has been treated by oncology, Dr. Alen Blew, with androgen deprivation and radiation.  However patient has done blood cast ration resistant metastatic disease to the bone and has been declining gradually over the last few months.  He has recently changed his oncologist to Dr. Whitney Muse, who he saw on 09/28/2014, to be closer to home.  Family reports that they were in the process of arranging hospice for the patient and have gotten home oxygen for his gradual decline in his breathing.  Most of the history was provided by wife and niece at bedside, as patient has been confused at home.  Patient's breathing has gradually worsened throughout the day and even home oxygen has not been helping him.  He presented to the emergency department for further evaluation and was started on 100% oxygen on BiPAP.  He was given a dose of vancomycin and cefepime with furosemide.  Hospitalist service was asked to admit the patient for further care and management.  Patient's mentation has improved on BiPAP.  Of note, patient has had thoracentesis on 09/08/2014 and 09/16/2014.  Review of Systems: Limited due to patient's confusion and worsening respiratory status.  Past Medical History  Diagnosis Date  . Cancer 2011    prostate w radiation  . Hyperlipemia   . Osteoporosis   . Hypertension    Past Surgical History  Procedure Laterality Date  . Partial gastrectomy  1965  . Transurethral resection of prostate  1995  . Cataract extraction  2007    Bilaterally  . Corneal transplant  2008     Bilaterally   Family History  Problem Relation Age of Onset  . Family history unknown: Yes   History   Social History  . Marital Status: Married    Spouse Name: N/A  . Number of Children: N/A  . Years of Education: N/A   Occupational History  . Not on file.   Social History Main Topics  . Smoking status: Never Smoker   . Smokeless tobacco: Not on file  . Alcohol Use: No  . Drug Use: No  . Sexual Activity: Not on file   Other Topics Concern  . Not on file   Social History Narrative   Allergies: Statins  Home Meds: Prior to Admission medications   Medication Sig Start Date End Date Taking? Authorizing Provider  abiraterone Acetate (ZYTIGA) 250 MG tablet Take 4 tablets (1,000 mg total) by mouth daily. Take on an empty stomach 1 hour before or 2 hours after a meal 09/30/14  Yes Wyatt Portela, MD  furosemide (LASIX) 20 MG tablet Take 20 mg by mouth daily. 09/14/14  Yes Historical Provider, MD  KLOR-CON 10 10 MEQ tablet Take 10 mEq by mouth daily. 09/14/14  Yes Historical Provider, MD  predniSONE (DELTASONE) 5 MG tablet Take one tablet daily with Zytiga. 09/02/14  Yes Wyatt Portela, MD  valACYclovir (VALTREX) 1000 MG tablet Take one tablet by mouth three times daily for 7 days 09/28/14  Yes Patrici Ranks, MD  Artificial Tear Solution (SOOTHE HYDRATION) 1.25 % SOLN Apply 1 drop to eye daily as needed (dry eyes).  Historical Provider, MD  Ascorbic Acid (VITAMIN C) 1000 MG tablet Take 1,000 mg by mouth daily.    Historical Provider, MD  aspirin 81 MG tablet Take 81 mg by mouth daily. Mon, wed, fri    Historical Provider, MD  Calcium 500 MG CHEW Chew 1 tablet by mouth 3 (three) times daily.    Historical Provider, MD  denosumab (XGEVA) 120 MG/1.7ML SOLN injection Inject into the skin.    Historical Provider, MD  diclofenac sodium (VOLTAREN) 1 % GEL Apply 2 g topically 4 (four) times daily. 07/26/14   Wyatt Portela, MD  fluconazole (DIFLUCAN) 100 MG tablet  06/24/14   Historical  Provider, MD  Multiple Vitamin (DAILY VITAMIN FORMULA PO) Take by mouth daily. OTC Men's One a Day (10 key nutrients).    Historical Provider, MD  omeprazole (PRILOSEC) 40 MG capsule Take 1 capsule (40 mg total) by mouth daily. 09/28/14   Patrici Ranks, MD  ondansetron (ZOFRAN-ODT) 4 MG disintegrating tablet  09/23/14   Historical Provider, MD  prednisoLONE acetate (PRED FORTE) 1 % ophthalmic suspension Place 1 drop into both eyes Nightly.    Historical Provider, MD  thiamine (VITAMIN B-1) 50 MG tablet Take 50 mg by mouth daily.    Historical Provider, MD  Vitamin D, Ergocalciferol, (DRISDOL) 50000 UNITS CAPS Take 50,000 Units by mouth every 14 (fourteen) days. On Thursday.    Historical Provider, MD    Physical Exam: Blood pressure 126/61, pulse 118, temperature 97 F (36.1 C), temperature source Core (Comment), resp. rate 27, weight 63.504 kg (140 lb), SpO2 96 %. General: Awake, Oriented to self, severe respiratory distress, very cachectic in appearance. HEENT: EOMI, dry mucous membranes Neck: Supple CV: S1 and S2 Lungs: Crackles at bases bilaterally, moderate air movement Abdomen: Soft, Nontender, Nondistended, +bowel sounds. Ext: Good pulses. Trace edema. No clubbing or cyanosis noted. Neuro: Limited due to patient's compliance.  Lab results:  Recent Labs  10/01/14 2047  NA 130*  K 4.0  CL 93*  CO2 27  GLUCOSE 440*  BUN 17  CREATININE 1.25  CALCIUM 8.4    Recent Labs  10/01/14 2047  AST 66*  ALT 27  ALKPHOS 156*  BILITOT 0.6  PROT 6.8  ALBUMIN 3.3*   No results for input(s): LIPASE, AMYLASE in the last 72 hours.  Recent Labs  10/01/14 2047  WBC 12.5*  NEUTROABS 11.0*  HGB 14.3  HCT 44.6  MCV 90.8  PLT 418*    Recent Labs  10/01/14 2047  TROPONINI 0.08*   Invalid input(s): POCBNP No results for input(s): DDIMER in the last 72 hours. No results for input(s): HGBA1C in the last 72 hours. No results for input(s): CHOL, HDL, LDLCALC, TRIG, CHOLHDL,  LDLDIRECT in the last 72 hours. No results for input(s): TSH, T4TOTAL, T3FREE, THYROIDAB in the last 72 hours.  Invalid input(s): FREET3 No results for input(s): VITAMINB12, FOLATE, FERRITIN, TIBC, IRON, RETICCTPCT in the last 72 hours. Imaging results:  Dg Chest 1 View  09/16/2014   CLINICAL DATA:  Thoracentesis.  Postprocedural radiograph.  EXAM: CHEST  1 VIEW  COMPARISON:  09/08/2014.  FINDINGS: There is no pneumothorax status post thoracentesis. The side of thoracentesis is not specified. Chronic diffuse pulmonary parenchymal opacity remains present, probably representing areas of scarring. Lung volumes are lower than on prior exam. Small bilateral pleural effusions are present.  IMPRESSION: No complications status post presumed RIGHT thoracentesis (based on ultrasound images).   Electronically Signed   By: Arvilla Market.D.  On: 09/16/2014 11:33   Dg Chest 1 View  09/08/2014   CLINICAL DATA:  BILATERAL pleural effusions post RIGHT thoracentesis  EXAM: CHEST  1 VIEW  COMPARISON:  09/01/2014  FINDINGS: Expiratory PA radiograph.  Bibasilar pleural effusions identified, decreased on RIGHT since previous exam.  Based on volume of fluid removed during thoracentesis, suspect RIGHT pleural effusion had significantly increased since the prior chest radiograph.  Mild bibasilar atelectasis greater on LEFT.  Persistent patchy pulmonary infiltrates.  Underlying COPD.  No pneumothorax.  Stable heart size and mediastinal contours.  Atherosclerotic calcification aorta noted.  Bones demineralized.  IMPRESSION: No pneumothorax following RIGHT thoracentesis.  Improved RIGHT basilar aeration.  Remainder of exam little changed.   Electronically Signed   By: Lavonia Dana M.D.   On: 09/08/2014 15:50   Dg Chest Port 1 View  10/01/2014   CLINICAL DATA:  Unresponsive, shortness of breath  EXAM: PORTABLE CHEST - 1 VIEW  COMPARISON:  09/16/2014  FINDINGS: Multifocal patchy opacities in the left upper lobe and throughout the  right lung, suspicious for multifocal pneumonia, with possible superimposed interstitial edema.  Layering right pleural effusion. Suspected small left pleural effusion.  No pneumothorax. Suspected skin fold along the right lateral hemithorax.  The heart is top-normal in size.  IMPRESSION: Multifocal patchy opacities in the left upper lobe and throughout the right lung, progressed from prior chest radiograph, suspicious for multifocal pneumonia.  Possible superimposed mild interstitial edema.  Layering right pleural effusion. Suspected small left pleural effusion.   Electronically Signed   By: Julian Hy M.D.   On: 10/01/2014 20:44   US Thoracentesis Asp Pleural Space W/img Guide  09/16/2014   INDICATION: Patient with history of prostate cancer, CHF, dyspnea, bilateral pleural effusions, right greater than left. Request is made for diagnostic and therapeutic right thoracentesis  EXAM: ULTRASOUND GUIDED DIAGNOSTIC AND THERAPEUTIC RIGHT THORACENTESIS  COMPARISON:  Prior thoracentesis on 09/08/2014  MEDICATIONS: None  COMPLICATIONS: None immediate  TECHNIQUE: Informed written consent was obtained from the patient after a discussion of the risks, benefits and alternatives to treatment. A timeout was performed prior to the initiation of the procedure.  Initial ultrasound scanning demonstrates a large right pleural effusion. The lower chest was prepped and draped in the usual sterile fashion. 1% lidocaine was used for local anesthesia.  An ultrasound image was saved for documentation purposes. An 6 Fr Safe-T-Centesis catheter was introduced. The thoracentesis was performed. The catheter was removed and a dressing was applied. The patient tolerated the procedure well without immediate post procedural complication. The patient was escorted to have an upright chest radiograph.  FINDINGS: A total of approximately 1.7 liters of slightly turbid, yellow fluid was removed. Requested samples were sent to the laboratory.   IMPRESSION: Successful ultrasound-guided diagnostic and therapeutic right sided thoracentesis yielding 1.7 liters of pleural fluid.  Read by: Rowe Robert, PA-C   Electronically Signed   By: Jacqulynn Cadet M.D.   On: 09/16/2014 12:49   US Thoracentesis Asp Pleural Space W/img Guide  09/08/2014   CLINICAL DATA:  BILATERAL pleural effusions, orthopnea, less shortness of breath when upright, history CHF, prostate cancer  EXAM: ULTRASOUND GUIDED THERAPEUTIC RIGHT THORACENTESIS  COMPARISON:  Chest radiograph 09/01/2014  PROCEDURE: Procedure, benefits, and risks of procedure were discussed with patient.  Written informed consent for procedure was obtained.  Time out protocol followed.  Pleural effusion localized by ultrasound at the posterior RIGHT hemithorax.  Skin prepped and draped in usual sterile fashion.  Skin and soft tissues  anesthetized with 6 mL of 1% lidocaine.  8 French thoracentesis catheter placed into the RIGHT pleural space.  1400 mL of clear yellow fluid aspirated by syringe pump.  Procedure tolerated very well by patient without immediate complication.  FINDINGS: As above  IMPRESSION: Successful ultrasound guided RIGHT thoracentesis yielding 1400 mL of pleural fluid.  Patient has a moderate-sized LEFT pleural effusion as well.  If patient remains symptomatic, may consider ultrasound guided LEFT thoracentesis.   Electronically Signed   By: Lavonia Dana M.D.   On: 09/08/2014 15:48   Other results: EKG: Sinus tachycardia with ventricular bigeminy, heart rate 124.  Assessment & Plan by Problem: Acute respiratory failure with hypoxia Patient on chest x-ray has multifocal patchy opacities in the left upper lobe and throughout the right lung, which has progressed from prior chest radiograph, concerning for multifocal pneumonia.  Chest x-ray also shows layering of right pleural effusion and suspected small left pleural effusion.  Had extensive discussion with patient, wife, niece and family.  Patient  had already indicated his wish is to be DO NOT RESUSCITATE.  Instructed patient and family that given his overall poor prognosis we could continue aggressive medical management, which includes antibiotics and BiPAP.  Patient and family indicated that given his age and poor prognosis he wishes to be comfortable and not pursue any aggressive medical treatment including antibiotics and BiPAP.  As per patient and family's wishes, discontinue all antibiotics and BiPAP, transition patient to comfort care.  Request hospice/palliative care consultation in the morning.  Also request oncology evaluation in the morning.  Patient and family has instructed that he could decline rapidly through the night and could possibly die in the hospital, they understand that he has poor prognosis.  Initiate comfort measures with when necessary morphine and Ativan for anxiety.  Oxygen as needed.  Healthcare associated pneumonia As above.  Metastatic neoplasm of the prostate Management as indicated above.  Requested consultation with oncology in the morning as per family's request.  Hyperglycemia Likely due to steroid use.  Prophylaxis None.  Patient on comfort care.  CODE STATUS DO NOT RESUSCITATE/DO NOT INTUBATE.  This was discussed with patient and family at the time of admission.  Disposition Admit to medical bed.  Overall poor prognosis, patient's condition may deteriorate rapidly, possible hospital death.  Time spent on admission, talking to the patient, and coordinating care was: 90 mins.  Janiyah Beery A, MD 10/01/2014, 11:51 PM

## 2014-10-01 NOTE — Progress Notes (Signed)
ANTIBIOTIC CONSULT NOTE - INITIAL  Pharmacy Consult for Vancomycin & Cefepime Indication: pneumonia  Allergies  Allergen Reactions  . Statins Other (See Comments)    Muscle weakness in arms and legs    Patient Measurements: Weight: 140 lb (63.504 kg) Adjusted Body Weight:   Vital Signs: Temp: 97 F (36.1 C) (03/19 2052) Temp Source: Core (Comment) (03/19 2052) BP: 126/61 mmHg (03/19 2100) Pulse Rate: 118 (03/19 2100) Intake/Output from previous day:   Intake/Output from this shift:    Labs: No results for input(s): WBC, HGB, PLT, LABCREA, CREATININE in the last 72 hours. CrCl cannot be calculated (Patient has no serum creatinine result on file.). No results for input(s): VANCOTROUGH, VANCOPEAK, VANCORANDOM, GENTTROUGH, GENTPEAK, GENTRANDOM, TOBRATROUGH, TOBRAPEAK, TOBRARND, AMIKACINPEAK, AMIKACINTROU, AMIKACIN in the last 72 hours.   Microbiology: Recent Results (from the past 720 hour(s))  Body fluid culture     Status: None   Collection Time: 09/16/14 11:08 AM  Result Value Ref Range Status   Specimen Description PLEURAL  Final   Special Requests NONE  Final   Gram Stain   Final    NO WBC SEEN NO ORGANISMS SEEN Performed at [facility]    Culture   Final    NO GROWTH 3 DAYS Performed at [facility]    Report Status 09/19/2014 FINAL  Final    Medical History: Past Medical History  Diagnosis Date  . Cancer 2011    prostate w radiation  . Hyperlipemia   . Osteoporosis   . Hypertension     Medications:   (Not in a hospital admission) Assessment: Patient in ED evaluation of unresponsiveness. X-ray patchy opacities both lungs consistent with pneumonia Cefepime and Vancomycin for treatment HCAP. CrCl 38 ml/min   Goal of Therapy:  Vancomycin trough 15-20  Plan: Vancomycin 1 GM IV loading dose, then Vancomycin 1 GM IV every 24 hours Cefepime 2 GM IV, then Cefepime 1 GM IV every 24 hours Vancomycin trough at steady state Monitor  renal function Labs per protocol   Chriss Czar 10/01/2014,9:24 PM

## 2014-10-01 NOTE — ED Notes (Signed)
Pt moved to the first isolation available

## 2014-10-02 ENCOUNTER — Encounter (HOSPITAL_COMMUNITY): Payer: Self-pay | Admitting: *Deleted

## 2014-10-02 DIAGNOSIS — A419 Sepsis, unspecified organism: Secondary | ICD-10-CM

## 2014-10-02 MED ORDER — POLYVINYL ALCOHOL 1.4 % OP SOLN: 1.0000 [drp] | Freq: Every day | OPHTHALMIC | Status: DC | PRN

## 2014-10-02 MED ORDER — MORPHINE SULFATE 2 MG/ML IJ SOLN: 1.0000 mg | INTRAMUSCULAR | Status: DC | PRN

## 2014-10-02 MED ORDER — VALACYCLOVIR HCL 500 MG PO TABS
1000.0000 mg | ORAL_TABLET | Freq: Three times a day (TID) | ORAL | Status: DC
  Administered 2014-10-02 – 2014-10-03 (×2): 1000 mg via ORAL
  Filled 2014-10-02 (×8): qty 2

## 2014-10-02 MED ORDER — FUROSEMIDE 20 MG PO TABS
20.0000 mg | ORAL_TABLET | Freq: Every day | ORAL | Status: DC
  Administered 2014-10-03: 20 mg via ORAL
  Filled 2014-10-02: qty 1

## 2014-10-02 MED ORDER — ACETAMINOPHEN 325 MG PO TABS: 650.0000 mg | ORAL_TABLET | Freq: Four times a day (QID) | ORAL | Status: DC | PRN

## 2014-10-02 MED ORDER — ACETAMINOPHEN 650 MG RE SUPP: 650.0000 mg | Freq: Four times a day (QID) | RECTAL | Status: DC | PRN

## 2014-10-02 MED ORDER — CEFEPIME HCL 1 G IJ SOLR: 1.0000 g | INTRAMUSCULAR | Status: DC

## 2014-10-02 MED ORDER — LORAZEPAM 2 MG/ML IJ SOLN: 1.0000 mg | INTRAMUSCULAR | Status: DC | PRN

## 2014-10-02 MED ORDER — CETYLPYRIDINIUM CHLORIDE 0.05 % MT LIQD
7.0000 mL | Freq: Two times a day (BID) | OROMUCOSAL | Status: DC
  Administered 2014-10-02 – 2014-10-03 (×2): 7 mL via OROMUCOSAL

## 2014-10-02 MED ORDER — FUROSEMIDE 10 MG/ML IJ SOLN
40.0000 mg | Freq: Once | INTRAMUSCULAR | Status: AC
  Administered 2014-10-02: 40 mg via INTRAVENOUS
  Filled 2014-10-02: qty 4

## 2014-10-02 MED ORDER — PANTOPRAZOLE SODIUM 40 MG PO TBEC
80.0000 mg | DELAYED_RELEASE_TABLET | Freq: Every day | ORAL | Status: DC
  Administered 2014-10-02 – 2014-10-03 (×2): 80 mg via ORAL
  Filled 2014-10-02 (×2): qty 2

## 2014-10-02 MED ORDER — VANCOMYCIN HCL IN DEXTROSE 1-5 GM/200ML-% IV SOLN: 1000.0000 mg | INTRAVENOUS | Status: DC

## 2014-10-02 MED ORDER — PREDNISOLONE ACETATE 1 % OP SUSP
1.0000 [drp] | Freq: Every day | OPHTHALMIC | Status: DC
  Administered 2014-10-02: 1 [drp] via OPHTHALMIC
  Filled 2014-10-02: qty 1

## 2014-10-02 MED ORDER — ONDANSETRON 4 MG PO TBDP
4.0000 mg | ORAL_TABLET | Freq: Three times a day (TID) | ORAL | Status: DC | PRN
  Administered 2014-10-02: 4 mg via ORAL
  Filled 2014-10-02: qty 1

## 2014-10-02 MED ORDER — PREDNISOLONE ACETATE 1 % OP SUSP
OPHTHALMIC | Status: AC
  Filled 2014-10-02: qty 5

## 2014-10-02 MED ORDER — VALACYCLOVIR HCL 500 MG PO TABS
ORAL_TABLET | ORAL | Status: AC
  Filled 2014-10-02: qty 2

## 2014-10-02 NOTE — Progress Notes (Signed)
GOC discussion at bedside with patient, wife, niece and nephew-in-law.  Discussed results of testing and current dx.  Discussed current treatment and GOC established last night.  Open-ended discussion to ascertain patient's wishes now that he can converse.  Options presented from continuing comfort care to full care including abx, full code status.  Patient repeatedly states his goal is comfort, he is getting weaker and weaker and is less able to do the things he enjoys. He does not want life prolonged and specifically does not want any curative treatment, no abx. He clearly expresses understanding of options and consequences of comfort care and full treatment.  Family agrees to abide by patient's wishes.  Plan:  Continue comfort care. No abx.   Hospice home vs hospice at home next 1-2 days.  Time spent 45 minutes direct face-to-face.  Murray Hodgkins, MD [facility] [phone]

## 2014-10-02 NOTE — ED Notes (Signed)
Hospitalist states pt will be taken off bi-pap and comfort measures on the monitor.

## 2014-10-02 NOTE — Progress Notes (Addendum)
PROGRESS NOTE  [name] [name] KVQ:[id number] DOB: 1926/05/08 DOA: 10/01/2014 PCP: Jerlyn Ly, MD  Summary: 81 year old man with history of metastatic prostate cancer presented with relatively acute onset of shortness of breath, lethargy and unresponsiveness. He is found to be in distress and started on BiPAP in the emergency department. Acute issues appear to be acute respiratory failure with hypoxia,  HCAP, recurrent pleural effusion. Admitting physician discussed with family and the family elected comfort care, no antibiotics;they requested consideration for hospice and palliative care.  According to chart the patient recently transitioned his care to oncology here in [place] and plans were being made for arranging hospice and home oxygen. He was diagnosed as shingles apparently last week.  Assessment/Plan: 1. Comfort care 2. Acute hypoxic respiratory failure. Stable on nonrebreather. Secondary to pneumonia, possibly also pleural effusion. 3. HCAP. Comfort care. 4. Sepsis secondary to HCAP with lactic acid 2.46 on admission, WBC 12.5, hypoxic respiratory failure and acute encephalopathy. 5. Acute encephalopathy. Appears resolved this point. 6. Hyponatremia, hypochloremia. Likely secondary to dehydration. 7. Demand ischemia secondary to sepsis. Asymptomatic. 8. Shingles. Appears confined to one dermatome. No evidence of systemic spread. 9. Recurrent pleural effusion, status post thoracentesis 2/25 and 3/4. 10. Lower extremity edema. 11. Hyperglycemia. 12. Metastatic prostate cancer to bone, initially diagnosed 2010.   Condition appears improved compared to last night by chart review. Patient is awake and alert. His family members are coming back in within the next hour and we will plan to follow-up on treatment plan and goals of care. By report they are interested in hospice.  Code Status: DNR, comfort care DVT prophylaxis: not appropriate Family Communication:  Disposition  Plan:   Murray Hodgkins, MD  [facility]  Pager [phone] If 7PM-7AM, please contact night-coverage at [email or web address], password [facility] 10/02/2014, 4:37 PM  LOS: 1 day   Consultants:  No oncology available until 3/21  No palliative care available until 3/22  Procedures:    Antibiotics:    HPI/Subjective: Has some shortness of breath. Nausea. No pain.  Objective: Filed Vitals:   10/01/14 2138 10/02/14 0143 10/02/14 0152 10/02/14 0651  BP:   109/64 93/41  Pulse:   60 94  Temp:    98.1 F (36.7 C)  TempSrc:    Oral  Resp: 27 24 24 24   Height:   5\' 9"  (1.753 m)   Weight:   63.504 kg (140 lb)   SpO2: 96%  100% 100%    Intake/Output Summary (Last 24 hours) at 10/02/14 1637 Last data filed at 10/02/14 0653  Gross per 24 hour  Intake    300 ml  Output      0 ml  Net    300 ml     Filed Weights   10/01/14 2026 10/02/14 0152  Weight: 63.504 kg (140 lb) 63.504 kg (140 lb)    Exam:     Afebrile, tachypneic, vitals otherwise stable. Oxygenation 100% on nonrebreather. General:  Appears calm and comfortable Cardiovascular: RRR, no m/r/g. 2+ bilateral LE edema. Telemetry: SR, no arrhythmias  Respiratory: CTA bilaterally, no w/r/r. Moderate increased respiratory effort. Able to speak in full sentences. Abdomen: soft, ntnd Skin: Erythematous rash lower left side of the abdomen as well as the left flank, does not cross midline, confined to one dermatome. Musculoskeletal: grossly normal tone BUE/BLE Psychiatric: grossly normal mood and affect, speech fluent and appropriate. Confused to month. Neurologic: grossly non-focal.  Data Reviewed:  On admission sodium 188, basic metabolic panel otherwise unremarkable.  BNP 1751, troponin  modestly elevated 0.08  WBC 12.5  Chest x-ray multifocal opacities left upper lobe and throughout the right lung suspicious for multifocal pneumonia. Layering right pleural effusion.  Scheduled Meds: . antiseptic oral rinse  7 mL  Mouth Rinse BID   Continuous Infusions:   Principal Problem:   Acute respiratory failure with hypoxia Active Problems:   Malignant neoplasm of prostate   HCAP (healthcare-associated pneumonia)   Hyperglycemia   Sepsis   Time spent 35 minutes, greater than 50% in counseling and coordination of care

## 2014-10-02 NOTE — Progress Notes (Signed)
Patient transferred from ER to 341 on V60 BIPAP;pt arrived safely with no issues;patient will be placed on a Non-re breather mask once family enters into the room for comfort care for the remainder of his time.

## 2014-10-03 DIAGNOSIS — G9340 Encephalopathy, unspecified: Secondary | ICD-10-CM

## 2014-10-03 LAB — URINE CULTURE
Colony Count: NO GROWTH
Culture: NO GROWTH

## 2014-10-03 MED ORDER — MORPHINE SULFATE 20 MG/5ML PO SOLN: 2.5000 mg | ORAL | Status: DC | PRN

## 2014-10-03 MED ORDER — MORPHINE SULFATE 20 MG/5ML PO SOLN
2.5000 mg | ORAL | Status: AC | PRN
Start: 2014-10-03 — End: ?

## 2014-10-03 MED ORDER — LORAZEPAM 0.5 MG PO TABS: 0.5000 mg | ORAL_TABLET | Freq: Three times a day (TID) | ORAL | Status: AC | PRN

## 2014-10-03 MED ORDER — ONDANSETRON 4 MG PO TBDP: 4.0000 mg | ORAL_TABLET | Freq: Three times a day (TID) | ORAL | Status: AC | PRN

## 2014-10-03 NOTE — Clinical Social Work Psychosocial (Signed)
Clinical Social Work Department BRIEF PSYCHOSOCIAL ASSESSMENT 10/03/2014  Patient:  [name] [name], [name] [name]     Account Number:  [id number]     Admit date:  10/01/2014  Clinical Social Worker:  Wyatt Haste  Date/Time:  10/03/2014 09:02 AM  Referred by:  CSW  Date Referred:  10/03/2014 Referred for  Residential hospice placement   Other Referral:   Interview type:  Patient Other interview type:    PSYCHOSOCIAL DATA Living Status:  WIFE Admitted from facility:   Level of care:   Primary support name:  Ruby Primary support relationship to patient:  SPOUSE Degree of support available:   supportive    CURRENT CONCERNS Current Concerns  Post-Acute Placement   Other Concerns:    SOCIAL WORK ASSESSMENT / PLAN CSW met with pt at bedside. Pt alert and oriented and reports he lives with his wife. The couple do not have any children or other family per pt. Pt's wife recently returned home from rehab after a hip fracture. CSW referred for residential hospice placement. Pt indicates he was diagnosed with prostate cancer in 2010. He has declined recently and is unable to do things that he enjoys. CSW provided support. Pt reports he has been considering comfort care for awhile, but is now ready. He has experience with hospice with his sister. Pt would prefer to go home if they feel they can manage, but is open to [facility] as well. Pt and wife have hired a Retail banker caregiver for several hours every day, but cannot afford to increase these hours. Pt agreed to have hospice staff come talk to them to discuss options in more detail in order to make decision. CSW has made hospice referral.   Assessment/plan status:  Referral to [employer] Other assessment/ plan:   Information/referral to community resources:   Hospice Home vs home with hospice    PATIENT'S/FAMILY'S RESPONSE TO PLAN OF CARE: Pt agreeable to hospice, but will determine whether to return home or consider Hospice Home  after meeting.       Benay Pike, [place]

## 2014-10-03 NOTE — Clinical Documentation Improvement (Signed)
Presents with Sepsis, Pneumonia, Encephalopathy, Acute Hypoxic Respiratory Failure. Patient on [place].   CHF is documented  BNP is 1751  Treated with IV Lasix 40mg  once and then continues with Lasix 20mg  PO daily  ECHO not seen in chart  Please clarify the likely type and acuity of CHF if known and document findings in next progress note and discharge summary if applicable. Possible Mortality Review.  Thank You, Zoila Shutter ,RN Clinical Documentation Specialist:  [place] [facility]

## 2014-10-03 NOTE — Progress Notes (Signed)
EMS has arrived to transfer pt to Hospice. Information packet given and pt is leaving in stable condition.

## 2014-10-03 NOTE — Discharge Summary (Addendum)
Physician Discharge Summary  [name] [name] FTD:[id number] DOB: 1926-01-06 DOA: 10/01/2014  PCP: [name] Ly, MD  I discussed his care with PCP prior to discharge, who concurs with plan of care; suspects lung abnormalities reflect metastatic cancer.  Admit date: 10/01/2014 Discharge date: 10/03/2014  Transfer to residential hospice for comfort care.  Follow-up Information    Follow up with [name] Ly, MD.   Specialty:  Internal Medicine   Why:  As needed   Contact information:   [address] [place] [address] [phone]      Discharge Diagnoses:  1. Acute hypoxic respiratory failure 2. HCAP 3. Sepsis secondary to HCAP 4. Acute encephalopathy 5. Demand ischemia secondary to sepsis 6. Metastatic prostate cancer 7. Shingles present on admission 8. Lower extremity edema without known history of CHF 9. Possible acute CHF, type unknown 10. Hyperglycemia  Discharge Condition: Stable, prognosis poor secondary to terminal disease and acute illness Disposition: Residential hospice  Diet recommendation: As desired  Filed Weights   10/01/14 2026 10/02/14 0152 10/03/14 0435  Weight: 63.504 kg (140 lb) 63.504 kg (140 lb) 62.9 kg (138 lb 10.7 oz)    History of present illness:  82 year old man with history of metastatic prostate cancer presented with relatively acute onset of shortness of breath, lethargy and unresponsiveness. He was found to be in distress and started on BiPAP in the emergency department. Acute issues appeared to be acute respiratory failure with hypoxia, HCAP, sepsis. Admitting physician discussed with family and the family elected comfort care, no antibiotics.  Hospital Course:  [name] [name] spontaneously improved and subsequently was able to express his own desires. He elected full comfort care, no antibiotics or curative attempts, nfurther testing or interventions. He wishes for full comfort and elected residential hospice. [name] [name] agreed with this  choice.  1. Acute hypoxic respiratory failure. Stable on nonrebreather. Secondary to pneumonia, possibly also pleural effusion. 2. HCAP. No antibiotics per patient's wishes. 3. Sepsis secondary to HCAP with lactic acid 2.46 on admission, WBC 12.5, hypoxic respiratory failure and acute encephalopathy. 4. Acute encephalopathy. Resolved. 5. Hyponatremia, hypochloremia. Likely secondary to dehydration. 6. Demand ischemia secondary to sepsis. Asymptomatic. 7. Shingles. Diagnosed prior to admission. Appears confined to one dermatome. No evidence of systemic spread. 8. Recurrent pleural effusion, status post thoracentesis 2/25 and 3/4. 9. Lower extremity edema. 10. Hyperglycemia. 11. Metastatic prostate cancer to bone, initially diagnosed 2010.  Discharge Instructions  Discharge Instructions    Discharge instructions    Complete by:  As directed   Activity as desired with assistance. Diet as desired.          Current Discharge Medication List    START taking these medications   Details  LORazepam (ATIVAN) 0.5 MG tablet Place 1 tablet (0.5 mg total) under the tongue every 8 (eight) hours as needed for anxiety. Qty: 30 tablet, Refills: 0    morphine 20 MG/5ML solution Take 0.6 mLs (2.4 mg total) by mouth every 4 (four) hours as needed for pain. Qty: 100 mL, Refills: 0      CONTINUE these medications which have CHANGED   Details  ondansetron (ZOFRAN-ODT) 4 MG disintegrating tablet Take 1 tablet (4 mg total) by mouth every 8 (eight) hours as needed for nausea or vomiting.      CONTINUE these medications which have NOT CHANGED   Details  furosemide (LASIX) 20 MG tablet Take 20 mg by mouth daily. Refills: 11    KLOR-CON 10 10 MEQ tablet Take 10 mEq by mouth daily.  Refills: 11    valACYclovir (VALTREX) 1000 MG tablet Take one tablet by mouth three times daily for 7 days Qty: 21 tablet, Refills: 1    Artificial Tear Solution (SOOTHE HYDRATION) 1.25 % SOLN Apply 1 drop to eye daily  as needed (dry eyes).    Associated Diagnoses: Malignant neoplasm of prostate    diclofenac sodium (VOLTAREN) 1 % GEL Apply 2 g topically 4 (four) times daily. Qty: 100 g, Refills: 3    omeprazole (PRILOSEC) 40 MG capsule Take 1 capsule (40 mg total) by mouth daily. Qty: 30 capsule, Refills: 3    prednisoLONE acetate (PRED FORTE) 1 % ophthalmic suspension Place 1 drop into both eyes Nightly.      STOP taking these medications     abiraterone Acetate (ZYTIGA) 250 MG tablet      predniSONE (DELTASONE) 5 MG tablet      Ascorbic Acid (VITAMIN C) 1000 MG tablet      aspirin 81 MG tablet      Calcium 500 MG CHEW      denosumab (XGEVA) 120 MG/1.7ML SOLN injection      fluconazole (DIFLUCAN) 100 MG tablet      Multiple Vitamin (DAILY VITAMIN FORMULA PO)      thiamine (VITAMIN B-1) 50 MG tablet      Vitamin D, Ergocalciferol, (DRISDOL) 50000 UNITS CAPS        Allergies  Allergen Reactions  . Statins Other (See Comments)    Muscle weakness in arms and legs    The results of significant diagnostics from this hospitalization (including imaging, microbiology, ancillary and laboratory) are listed below for reference.    Significant Diagnostic Studies: Dg Chest Port 1 View  10/01/2014   CLINICAL DATA:  Unresponsive, shortness of breath  EXAM: PORTABLE CHEST - 1 VIEW  COMPARISON:  09/16/2014  FINDINGS: Multifocal patchy opacities in the left upper lobe and throughout the right lung, suspicious for multifocal pneumonia, with possible superimposed interstitial edema.  Layering right pleural effusion. Suspected small left pleural effusion.  No pneumothorax. Suspected skin fold along the right lateral hemithorax.  The heart is top-normal in size.  IMPRESSION: Multifocal patchy opacities in the left upper lobe and throughout the right lung, progressed from prior chest radiograph, suspicious for multifocal pneumonia.  Possible superimposed mild interstitial edema.  Layering right pleural  effusion. Suspected small left pleural effusion.   Electronically Signed   By: Julian Hy M.D.   On: 10/01/2014 20:44   Microbiology: Recent Results (from the past 240 hour(s))  Culture, blood (routine x 2)     Status: None (Preliminary result)   Collection Time: 10/01/14  8:47 PM  Result Value Ref Range Status   Specimen Description BLOOD RIGHT ANTECUBITAL  Final   Special Requests BOTTLES DRAWN AEROBIC ONLY [place]  Final   Culture NO GROWTH 2 DAYS  Final   Report Status PENDING  Incomplete  Culture, blood (routine x 2)     Status: None (Preliminary result)   Collection Time: 10/01/14  8:55 PM  Result Value Ref Range Status   Specimen Description BLOOD RIGHT ANTECUBITAL  Final   Special Requests BOTTLES DRAWN AEROBIC ONLY 5CC  Final   Culture NO GROWTH 2 DAYS  Final   Report Status PENDING  Incomplete     Labs: Basic Metabolic Panel:  Recent Labs Lab 10/01/14 2047  NA 130*  K 4.0  CL 93*  CO2 27  GLUCOSE 440*  BUN 17  CREATININE 1.25  CALCIUM 8.4  Liver Function Tests:  Recent Labs Lab 10/01/14 2047  AST 66*  ALT 27  ALKPHOS 156*  BILITOT 0.6  PROT 6.8  ALBUMIN 3.3*   CBC:  Recent Labs Lab 10/01/14 2047  WBC 12.5*  NEUTROABS 11.0*  HGB 14.3  HCT 44.6  MCV 90.8  PLT 418*   Cardiac Enzymes:  Recent Labs Lab 10/01/14 2047  TROPONINI 0.08*     Recent Labs  10/01/14 2047  BNP 1751.0*     Principal Problem:   Acute respiratory failure with hypoxia Active Problems:   Malignant neoplasm of prostate   HCAP (healthcare-associated pneumonia)   Hyperglycemia   Sepsis   Time coordinating discharge: 25 minutes  Signed:  Murray Hodgkins, MD [facility] 10/03/2014, 2:29 PM

## 2014-10-03 NOTE — Progress Notes (Signed)
Patient is being discharged to [facility] was called,and given to [employer]. Patient is on 100 percent non rebreather,and will maintain his foley. No c/o pain or discomfort at this time. Family at bedside. Waiting for EMS to transport patient to awaiting facility.

## 2014-10-03 NOTE — Care Management Note (Signed)
    Page 1 of 1   10/03/2014     2:36:34 PM CARE MANAGEMENT NOTE 10/03/2014  Patient:  BEVIN, MAYALL   Account Number:  [id number]  Date Initiated:  10/03/2014  Documentation initiated by:  Theophilus Kinds  Subjective/Objective Assessment:   Pt admitted from home with altered mental status. Pt lives with his wife and has paid caregivers in the home several hours a day. Pt and family interested in residential hospice.     Action/Plan:   Pt to discharge to [facility] today. Pts nurse to call EMS once all paperwork completed. No other CM needs noted.   Anticipated DC Date:  10/03/2014   Anticipated DC Plan:  [place]  In-house referral  Clinical Social Worker      DC Planning Services  CM consult      [facility] Choice  HOSPICE   Choice offered to / List presented to:  C-1 Patient           Status of service:  Completed, signed off Medicare Important Message given?  NA - LOS <3 / Initial given by admissions (If response is "NO", the following Medicare IM given date fields will be blank) Date Medicare IM given:   Medicare IM given by:   Date Additional Medicare IM given:   Additional Medicare IM given by:    Discharge Disposition:  [place]  Per UR Regulation:    If discussed at Long Length of Stay Meetings, dates discussed:    Comments:  10/03/14 [place], RN BSN CM

## 2014-10-03 NOTE — Progress Notes (Signed)
UR chart review completed.  

## 2014-10-03 NOTE — Progress Notes (Signed)
PROGRESS NOTE  [name] [name] ZDG:[id number] DOB: [birth year] DOA: 10/01/2014 PCP: Jerlyn Ly, MD  Summary: 81 year old man with history of metastatic prostate cancer presented with relatively acute onset of shortness of breath, lethargy and unresponsiveness. He is found to be in distress and started on BiPAP in the emergency department. Acute issues appear to be acute respiratory failure with hypoxia,  HCAP, recurrent pleural effusion. Admitting physician discussed with family and the family elected comfort care, no antibiotics;they requested consideration for hospice and palliative care.  Assessment/Plan: 1. Comfort care 2. Acute hypoxic respiratory failure. Stable on nonrebreather. Secondary to pneumonia, possibly also pleural effusion. 3. HCAP.  No antibiotics per patient's wishes. 4. Sepsis secondary to HCAP with lactic acid 2.46 on admission, WBC 12.5, hypoxic respiratory failure and acute encephalopathy. 5. Acute encephalopathy. Resolved. 6. Hyponatremia, hypochloremia. Likely secondary to dehydration. 7. Demand ischemia secondary to sepsis. Asymptomatic. 8. Shingles. Diagnosed prior to admission. Appears confined to one dermatome. No evidence of systemic spread. 9. Recurrent pleural effusion, status post thoracentesis 2/25 and 3/4. 10. Lower extremity edema. 11. Hyperglycemia. 12. Metastatic prostate cancer to bone, initially diagnosed 2010.   Patient has elected to go to residential hospice. He desires full comfort care.  Transfer to residential hospice today.  Has not required any morphine or Ativan since admission  Discussed with wife at bedside and nephew-in-law at bedside.  Murray Hodgkins, MD  [facility]  Pager [phone] If 7PM-7AM, please contact night-coverage at [email or web address], password [facility] 10/03/2014, 1:44 PM  LOS: 2 days   Consultants:  No oncology available until 3/21  No palliative care available until  3/22  Procedures:    Antibiotics:    HPI/Subjective: Comfortable. No pain. No needs at this time. He has decided to go to residential hospice.  Objective: Filed Vitals:   10/02/14 0651 10/02/14 1645 10/02/14 2141 10/03/14 0435  BP: 93/41 106/50 103/48 103/49  Pulse: 94 100 96 92  Temp: 98.1 F (36.7 C) 98.2 F (36.8 C) 98.1 F (36.7 C) 98 F (36.7 C)  TempSrc: Oral Oral Oral Oral  Resp: 24 24 24 22   Height:      Weight:    62.9 kg (138 lb 10.7 oz)  SpO2: 100% 100% 99% 99%    Intake/Output Summary (Last 24 hours) at 10/03/14 1344 Last data filed at 10/03/14 1000  Gross per 24 hour  Intake      0 ml  Output   1600 ml  Net  -1600 ml     Filed Weights   10/01/14 2026 10/02/14 0152 10/03/14 0435  Weight: 63.504 kg (140 lb) 63.504 kg (140 lb) 62.9 kg (138 lb 10.7 oz)    Exam:     Afebrile, tachypneic, normotensive, oxygenation stable on nonrebreather. General:  Appears calm and comfortable Cardiovascular: RRR, no m/r/g.  Respiratory: Generally clear to auscultation. No frank wheezes, rales or rhonchi. Mild increased respiratory effort. Able to speak in full sentences.Marland Kitchen Psychiatric: grossly normal mood and affect, speech fluent and appropriate  Data Reviewed:  No new data  Chest x-ray multifocal opacities left upper lobe and throughout the right lung suspicious for multifocal pneumonia. Layering right pleural effusion.  Scheduled Meds: . antiseptic oral rinse  7 mL Mouth Rinse BID  . furosemide  20 mg Oral Daily  . pantoprazole  80 mg Oral Daily  . prednisoLONE acetate  1 drop Both Eyes QHS  . valACYclovir  1,000 mg Oral TID   Continuous Infusions:   Principal Problem:   Acute respiratory failure with  hypoxia Active Problems:   Malignant neoplasm of prostate   HCAP (healthcare-associated pneumonia)   Hyperglycemia   Sepsis

## 2014-10-04 ENCOUNTER — Ambulatory Visit (HOSPITAL_COMMUNITY): Payer: Medicare Other | Admitting: Hematology & Oncology

## 2014-10-05 ENCOUNTER — Encounter: Payer: Self-pay | Admitting: *Deleted

## 2014-10-05 ENCOUNTER — Ambulatory Visit: Payer: Self-pay | Admitting: *Deleted

## 2014-10-05 NOTE — Patient Outreach (Signed)
[name] [name] was found unresponsive at home on Sunday and admitted to [facility]. He was subsequently transferred to [facility] for end of life care.   Discharge from [place] services.

## 2014-10-05 NOTE — Patient Outreach (Signed)
   10/06/2014  Dr. Elta Guadeloupe Perini  EQ:[facility] [name] [name] 08-01-1925 [id number]   Dear Dr. Joylene Draft,   I recently met [name] [name] when his wife was referred to me for case management services. He asked if I would be willing to help him also with care coordination needs for his own health management. I gladly accepted and admitted him as a new [facility] patient.   As you kno Mr. Donado was found unresponsive at home and was subsequently transferred to [facility] after a brief admission at [facility]. I regret that I will not be able to work with Mr. Nimmons. He is a delightful gentleman. I will continue to work with his wife.   Sincerely,   [facility]  [address] [place], [place] [address] [email or web address]   [phone] (mobile)  [phone] (main office) [phone] (fax)

## 2014-10-06 ENCOUNTER — Ambulatory Visit: Payer: Medicare Other | Admitting: Oncology

## 2014-10-06 ENCOUNTER — Other Ambulatory Visit: Payer: Medicare Other

## 2014-10-06 LAB — CULTURE, BLOOD (ROUTINE X 2)
CULTURE: NO GROWTH
Culture: NO GROWTH

## 2014-10-14 DEATH — deceased

## 2014-10-17 ENCOUNTER — Other Ambulatory Visit (HOSPITAL_COMMUNITY): Payer: Medicare Other

## 2014-10-17 ENCOUNTER — Ambulatory Visit (HOSPITAL_COMMUNITY): Payer: Medicare Other | Admitting: Hematology & Oncology

## 2014-10-22 NOTE — Progress Notes (Signed)
[facility] CONSULT NOTE  Patient Care Team: Crist Infante, MD as PCP - General (Internal Medicine)  CHIEF COMPLAINTS/PURPOSE OF CONSULTATION:  Castration resistant stage IV prostate cancer Initial diagnosis in 2010, Gleason score 4+3 = 7 and PSA of 9.43 Definitive radiation therapy for initial treatment, brief period of [facility] agonist therapy with PSA nadir of 0.082 years Bony metastases developing in 2014 with PSA of 39 Androgen deprivation therapy and PSA nadir of 5.23 Recent elevation of PSA and generalized decline. XTANDI initiated on 12/16/2013, PSA decline from 50.48 to 0.51 Worsening shortness of breath CT of chest on 08/24/2014 moderate bilateral pleural effusions, patchy peripheral groundglass opacity and airspace disease, likely infiltrate versus pneumonia Bone scan on 08/09/2014 increased uptake at L1 vertebral body, uptake in right iliac wing compatible with progressive disease. Q2/2016uestion bladder outlet obstruction, mottled appearance of ribs ZYTIGA started 08/2014  HISTORY OF PRESENTING ILLNESS:  [name] [name] 81 y.o. male is here because of advanced adenocarcinoma of the prostate. He has had a generalized decline and has significant change in his performance status. He and his wife feel unable to tablet [facility] for his care. He has recently been started on ZYTIGA but is not sure it is going to help. At his last visit in [place] with Dr.Shadad, hospice was presented as an option. The patient states he is just not willing to give up yet. He seems to understand that his cancer however is terminal.  Focused today is on his poor appetite, weight loss, and worsening shortness of breath. He notes he has had a thoracentesis twice. He reports coughing up clear sputum and thinks that is getting worse. He now has to sleep in a lift chair because he cannot breathe when he lies flat. He says his shortness of breath is his biggest discomfort.  He has developed a  rash but states he does not think it is shingles, it is painful. He has a lot of nausea and difficulties with eating. Has nausea medications no longer help.  MEDICAL HISTORY:  Past Medical History  Diagnosis Date  . Cancer 2011    prostate w radiation  . Hyperlipemia   . Osteoporosis     SURGICAL HISTORY: Past Surgical History  Procedure Laterality Date  . Partial gastrectomy  1965  . Transurethral resection of prostate  1995  . Cataract extraction  2007    Bilaterally  . Corneal transplant  2008    Bilaterally    SOCIAL HISTORY: History   Social History  . Marital Status: Married    Spouse Name: N/A  . Number of Children: N/A  . Years of Education: N/A   Occupational History  . Not on file.   Social History Main Topics  . Smoking status: Never Smoker   . Smokeless tobacco: Not on file  . Alcohol Use: No  . Drug Use: No  . Sexual Activity: Not on file   Other Topics Concern  . Not on file   Social History Narrative   he is married and has been married for many years., 60 years. They have no children. 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  FAMILY HISTORY: Family History  Problem Relation Age of Onset  . Family history unknown: Yes   has no family status information on file.   ALLERGIES:  is allergic to statins.  MEDICATIONS:  Current Outpatient Prescriptions  Medication Sig Dispense Refill  . Artificial Tear Solution (SOOTHE HYDRATION) 1.25 % SOLN Apply 1 drop to eye daily  as needed (dry eyes).     Marland Kitchen diclofenac sodium (VOLTAREN) 1 % GEL Apply 2 g topically 4 (four) times daily. 100 g 3  . furosemide (LASIX) 20 MG tablet Take 20 mg by mouth daily.  11  . KLOR-CON 10 10 MEQ tablet Take 10 mEq by mouth daily.  11  . prednisoLONE acetate (PRED FORTE) 1 % ophthalmic suspension Place 1 drop into both eyes Nightly.    Marland Kitchen LORazepam (ATIVAN) 0.5 MG tablet Place 1 tablet (0.5 mg total) under the tongue every 8 (eight) hours as needed for anxiety. 30 tablet 0  .  morphine 20 MG/5ML solution Take 0.6 mLs (2.4 mg total) by mouth every 4 (four) hours as needed (moderate to severe pain). 100 mL 0  . omeprazole (PRILOSEC) 40 MG capsule Take 1 capsule (40 mg total) by mouth daily. 30 capsule 3  . ondansetron (ZOFRAN-ODT) 4 MG disintegrating tablet Take 1 tablet (4 mg total) by mouth every 8 (eight) hours as needed for nausea or vomiting.    . valACYclovir (VALTREX) 1000 MG tablet Take one tablet by mouth three times daily for 7 days 21 tablet 1   No current facility-administered medications for this visit.    Review of Systems  Constitutional: Positive for weight loss and malaise/fatigue.  HENT: Negative.   Eyes: Negative.   Respiratory: Positive for cough, sputum production and shortness of breath.   Gastrointestinal: Positive for nausea.  Genitourinary: Positive for urgency and frequency.  Musculoskeletal: Positive for myalgias, back pain and joint pain.  Skin: Positive for rash.  Neurological: Positive for weakness. Negative for sensory change, speech change, focal weakness, seizures and loss of consciousness.  Endo/Heme/Allergies: Negative.   Psychiatric/Behavioral: Positive for depression. The patient is nervous/anxious.     PHYSICAL EXAMINATION: ECOG PERFORMANCE STATUS: 2 - Symptomatic, <50% confined to bed  Filed Vitals:   09/28/14 1426  BP: 118/57  Pulse: 95  Temp: 98.3 F (36.8 C)  Resp: 18   Filed Weights   09/28/14 1426  Weight: 140 lb (63.504 kg)     Physical Exam  Constitutional: He is oriented to person, place, and time.  Thin, Frail appearing, chronically ill male  HENT:  Head: Normocephalic and atraumatic.  Nose: Nose normal.  Mouth/Throat: Oropharynx is clear and moist. No oropharyngeal exudate.  Eyes: Conjunctivae and EOM are normal. Pupils are equal, round, and reactive to light. Right eye exhibits no discharge. Left eye exhibits no discharge. No scleral icterus.  Neck: Normal range of motion. Neck supple. No  tracheal deviation present. No thyromegaly present.  Cardiovascular: Normal rate, regular rhythm and normal heart sounds.  Exam reveals no gallop and no friction rub.   No murmur heard. Pulmonary/Chest: He has no wheezes. He has no rales.  Appears slightly uncomfortable in his breathing but more so with ambulation. Generalized decreased breath sounds throughout greatest at the bases  Abdominal: Soft. Bowel sounds are normal. He exhibits no distension and no mass. There is no tenderness. There is no rebound and no guarding.  Musculoskeletal: Normal range of motion. He exhibits no edema.  Lymphadenopathy:    He has no cervical adenopathy.  Neurological: He is alert and oriented to person, place, and time. He has normal reflexes. No cranial nerve deficit. Gait normal. Coordination normal.  Skin: Skin is warm and dry. No rash noted.  Rash very consistent with shingles left-sided at the T11 dermatomal distribution encompassing the anterior abdomen and back and flank.  Psychiatric: Mood, memory, affect and judgment normal.  Nursing note and vitals reviewed.    LABORATORY DATA:  I have reviewed the data as listed Lab Results  Component Value Date   WBC 12.5* 10/01/2014   HGB 14.3 10/01/2014   HCT 44.6 10/01/2014   MCV 90.8 10/01/2014   PLT 418* 10/01/2014     Chemistry      Component Value Date/Time   NA 130* 10/01/2014 2047   NA 137 08/31/2014 1438   K 4.0 10/01/2014 2047   K 4.7 08/31/2014 1438   CL 93* 10/01/2014 2047   CO2 27 10/01/2014 2047   CO2 28 08/31/2014 1438   BUN 17 10/01/2014 2047   BUN 12.4 08/31/2014 1438   CREATININE 1.25 10/01/2014 2047   CREATININE 0.9 08/31/2014 1438      Component Value Date/Time   CALCIUM 8.4 10/01/2014 2047   CALCIUM 9.0 08/31/2014 1438   ALKPHOS 156* 10/01/2014 2047   ALKPHOS 70 08/31/2014 1438   AST 66* 10/01/2014 2047   AST 21 08/31/2014 1438   ALT 27 10/01/2014 2047   ALT 9 08/31/2014 1438   BILITOT 0.6 10/01/2014 2047   BILITOT  0.61 08/31/2014 1438       RADIOGRAPHIC STUDIES: I have personally reviewed the radiological images as listed and agreed with the findings in the report.   ASSESSMENT & PLAN:   Stage IV hormone refractory adenocarcinoma of the prostate Shingles Poor performance status, generalized decline, malnutrition Pleural effusion, shortness of breath, hypoxemia  Prostate cancer  He wishes to continue on his current medication. I also discussed with the patient and his wife that palliative care/hospice but greatly improve his quality by controlling his multiple symptoms that are a result of his advanced disease. My recommendation would be to discontinue additional therapy and pursue a hospice referral. He is already a DO NOT RESUSCITATE. They want to continue to think about this. We spent a great amount of time in discussion of the benefits of adequate symptom control. I will see him back in 2 weeks to discuss this further.  Nausea  We will add a PPI to his current medication regimen. I have also encouraged him to continue with his anti-emetics on a schedule.  Hypoxemia, shortness of breath.  I advised him that this will continue to be initiated since it is most likely a malignancy induced. The checked his oxygenation today at rest and with ambulation and he qualifies for home oxygen we will arrange for this. I emphasized to the patient and his wife that hospice would be beneficial in helping control his breathlessness.  Shingles   He will be treated with Valtrex. We have called this medication in for them. He currently is not having significant pain.   No problem-specific assessment & plan notes found for this encounter.  Orders Placed This Encounter  Procedures  . CBC with Differential    Standing Status: Future     Number of Occurrences:      Standing Expiration Date: 09/28/2015  . Comprehensive metabolic panel    Standing Status: Future     Number of Occurrences:      Standing  Expiration Date: 09/28/2015  . PSA    Standing Status: Future     Number of Occurrences:      Standing Expiration Date: 09/28/2015    All questions were answered. The patient knows to call the clinic with any problems, questions or concerns.  This note was electronically signed.    Molli Hazard, MD  10/22/2014 8:57 AM

## 2014-10-28 ENCOUNTER — Ambulatory Visit: Payer: Medicare Other | Admitting: Urology

## 2014-11-01 ENCOUNTER — Ambulatory Visit: Payer: Medicare Other | Admitting: Urology

## 2016-01-15 IMAGING — DX DG CHEST 2V
2 series · 2 of 2 positions shown · non-contrast
Comparison: 08/30/2014

CLINICAL DATA: Pneumonia

EXAM:
CHEST  2 VIEW

[chest pa]
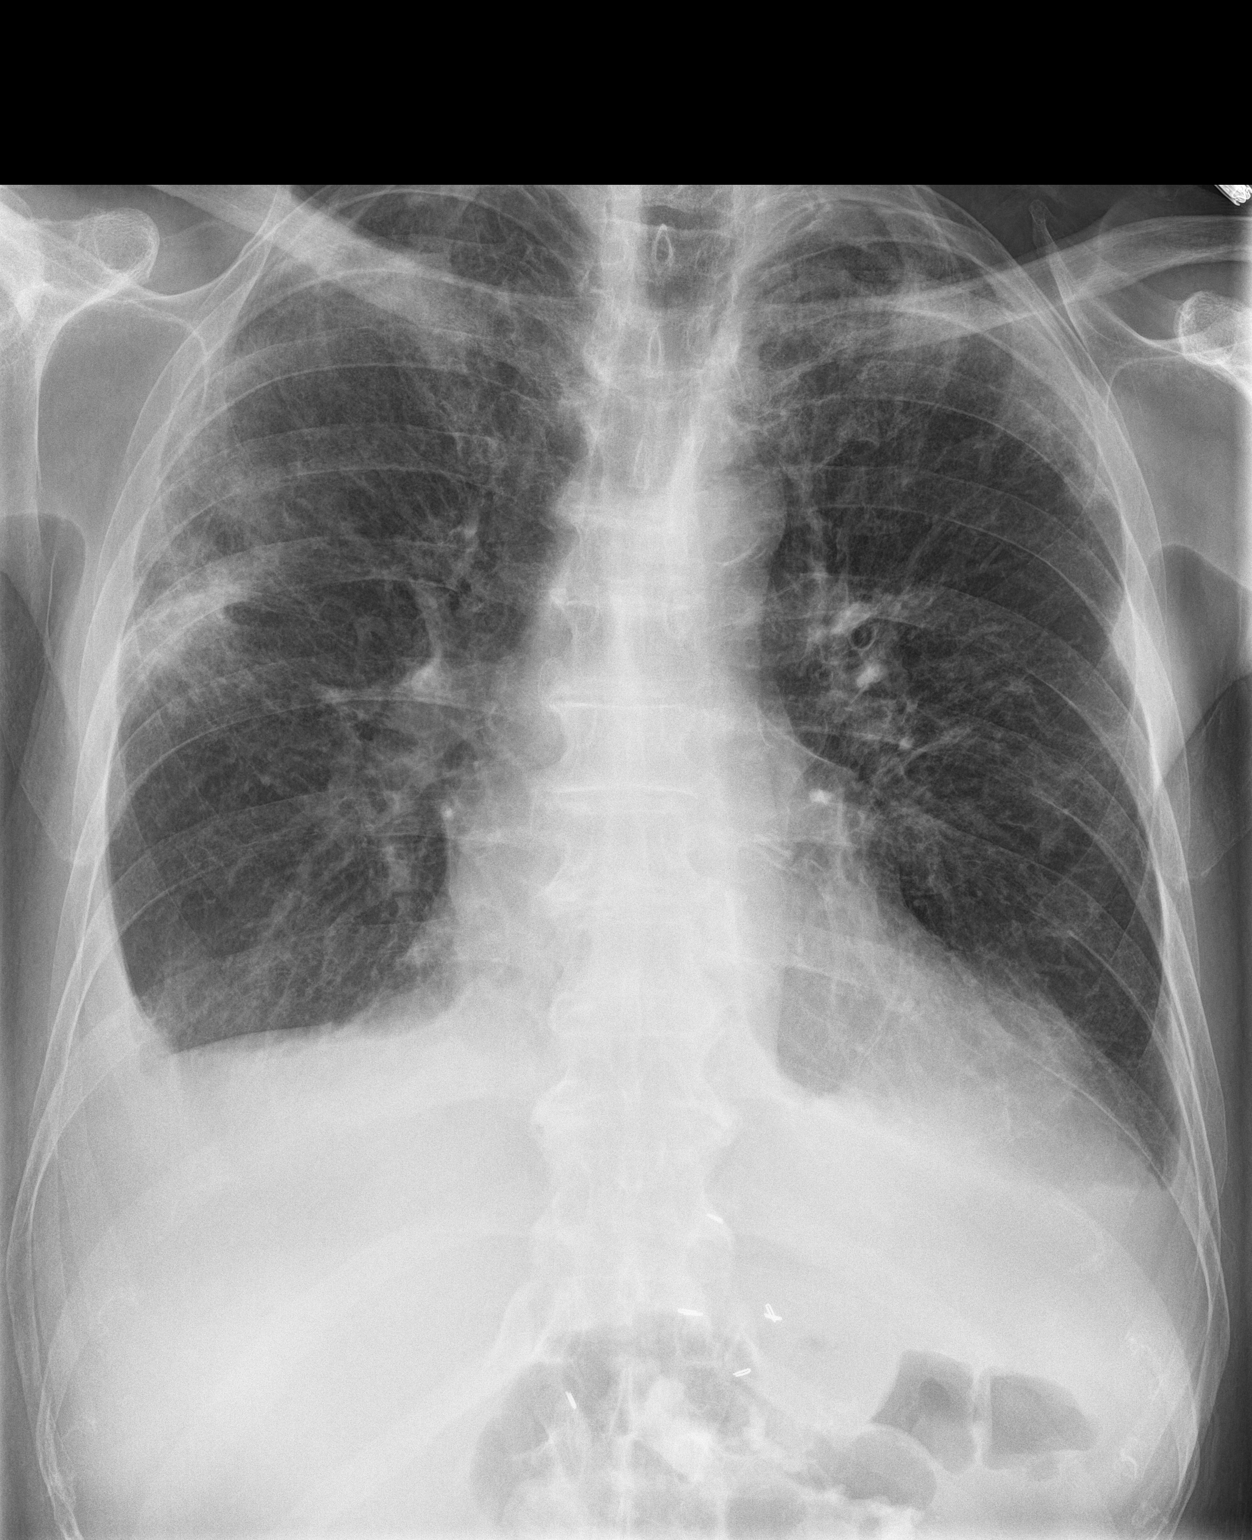

[chest lat]
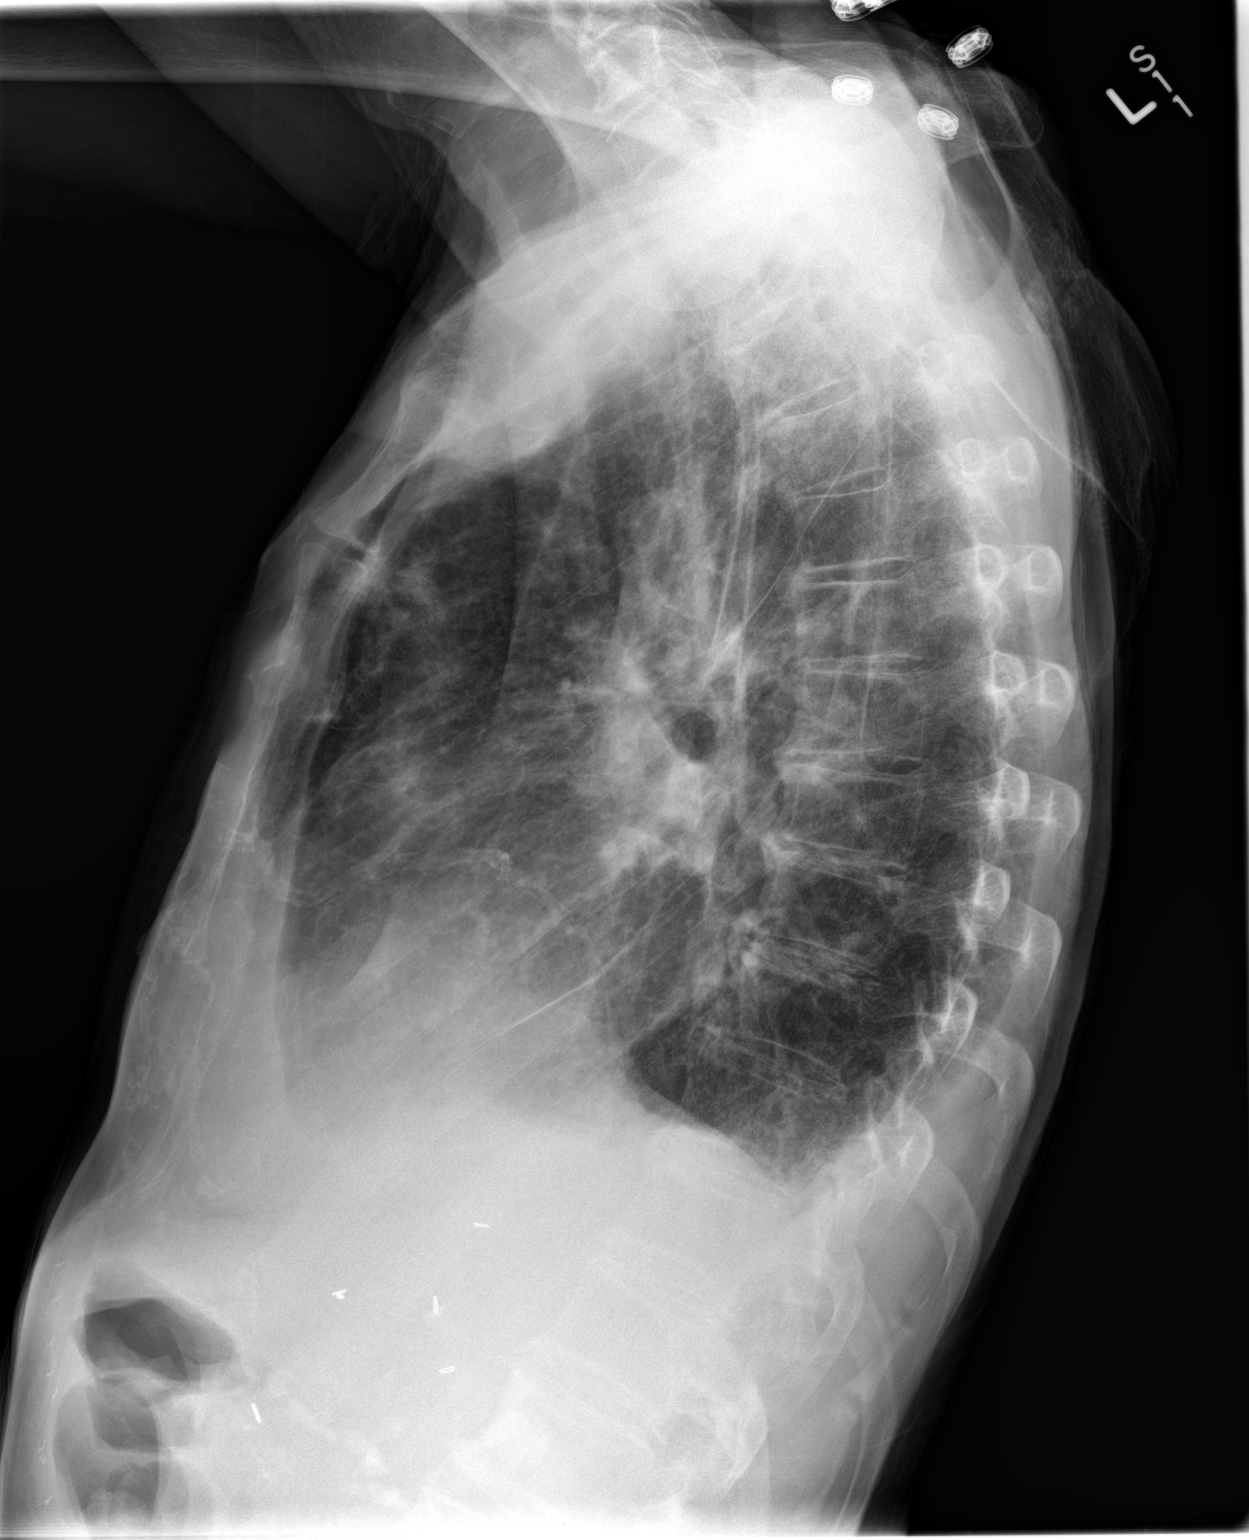

[2 of 2 positions shown; findings below may reference images not displayed]

FINDINGS: Patchy bilateral lung opacity in the apical lungs and in the right
mid chest, stable from prior. Small bilateral pleural effusions
without interval increase. No pulmonary edema. No cardiomegaly or
aortic/hilar contour abnormality.
IMPRESSION: Unchanged bilateral pneumonia with small pleural effusions.

## 2016-01-22 IMAGING — US US THORACENTESIS ASP PLEURAL SPACE W/IMG GUIDE
1 series · 4 of 4 positions shown · non-contrast
Comparison: Chest radiograph 09/01/2014

CLINICAL DATA: BILATERAL pleural effusions, orthopnea, less
shortness of breath when upright, history CHF, prostate cancer

EXAM:
ULTRASOUND GUIDED THERAPEUTIC RIGHT THORACENTESIS

[Series 1: us thoracentesis asp pleural space w/img guide · 0.21mm/px · 4 of 4 slices shown]
[im 1/4]
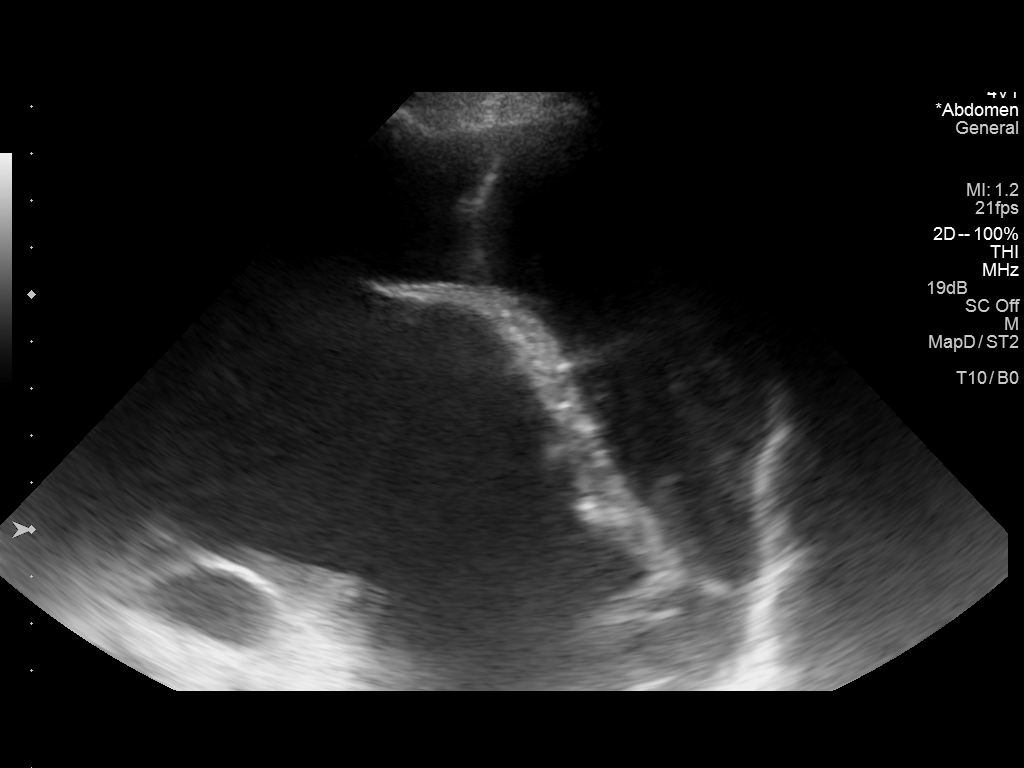
[im 2/4]
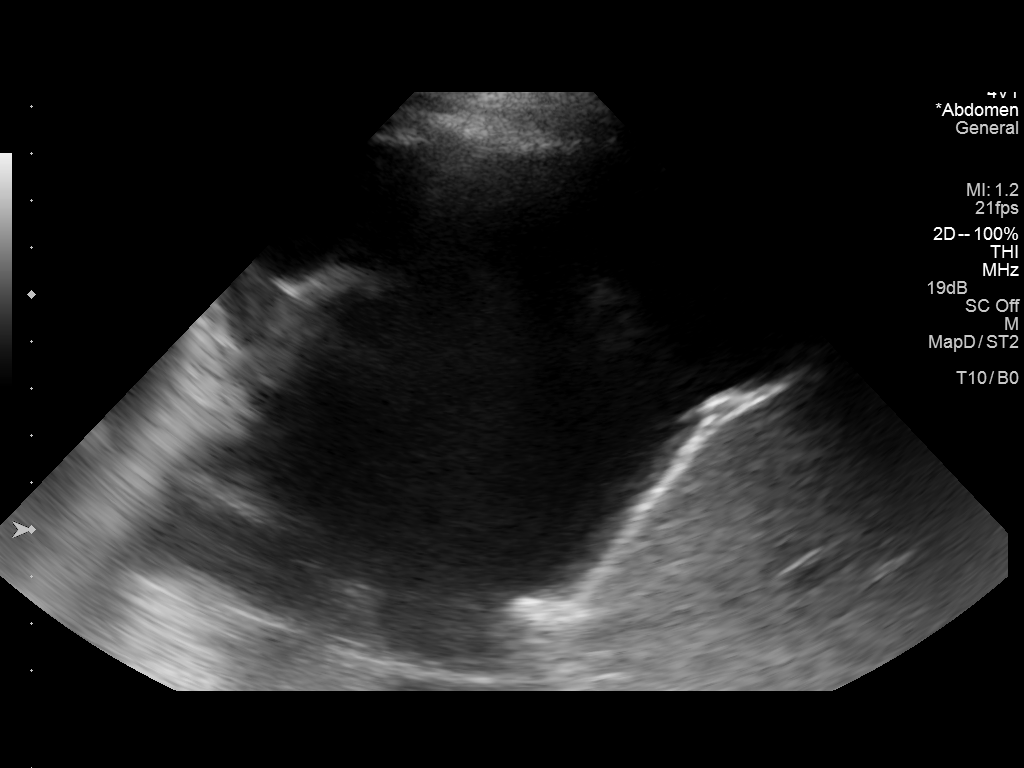
[im 3/4]
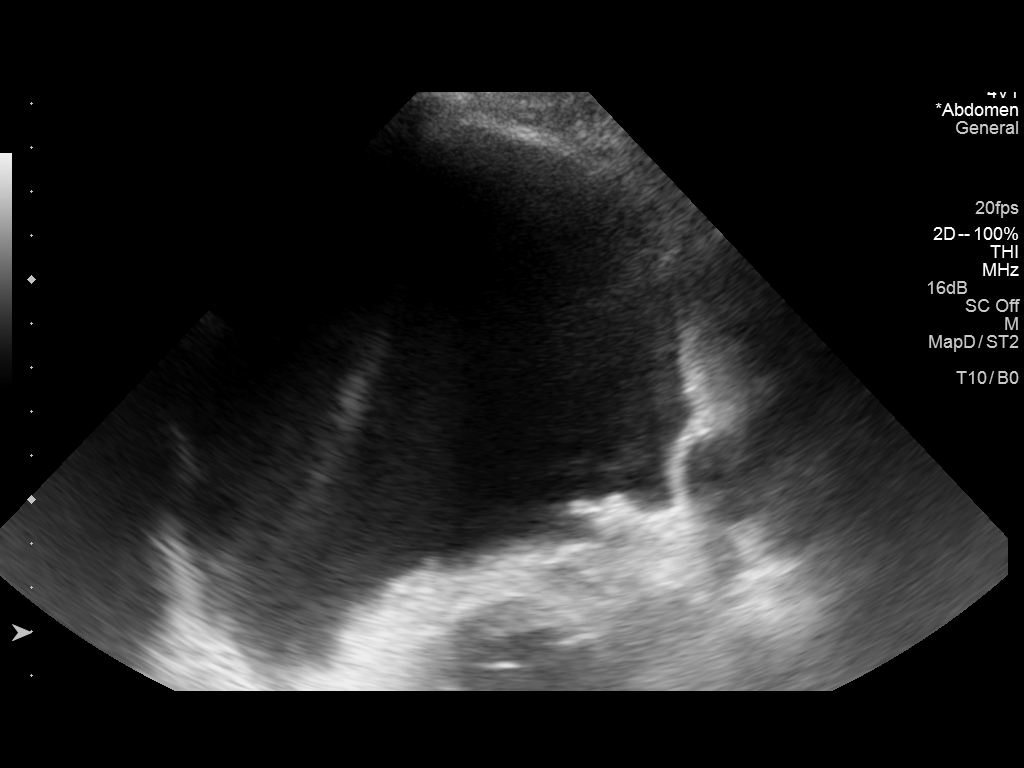
[im 4/4]
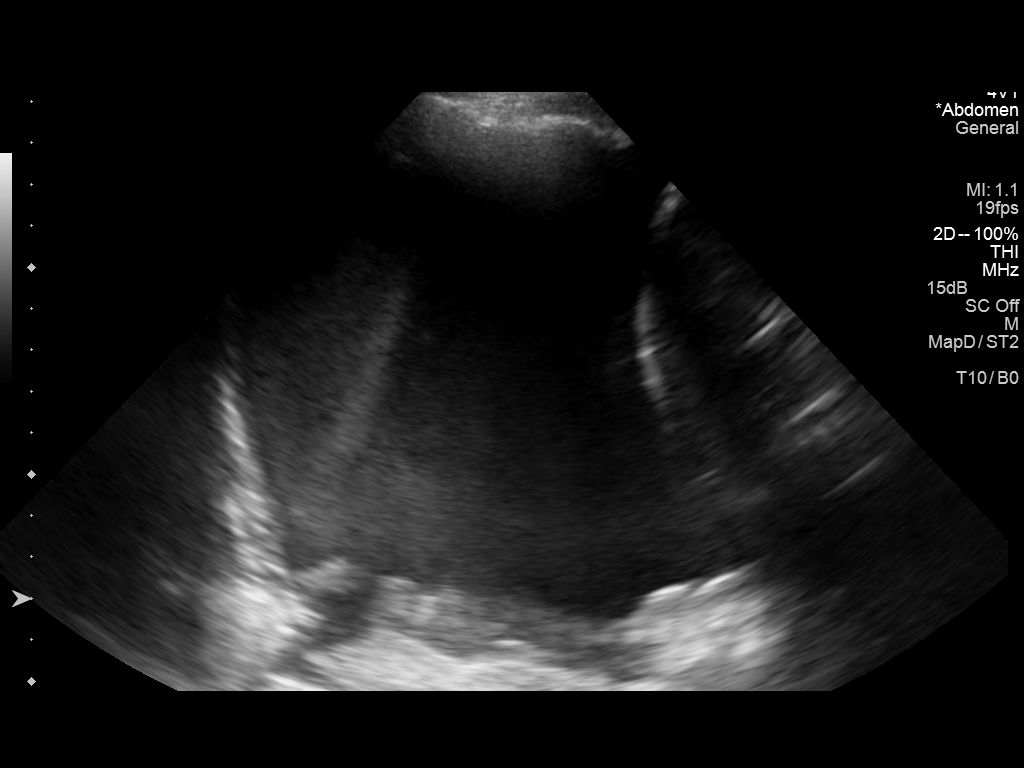

[4 of 4 positions shown; findings below may reference images not displayed]

PROCEDURE:
Procedure, benefits, and risks of procedure were discussed with
patient.

Written informed consent for procedure was obtained.

Time out protocol followed.

Pleural effusion localized by ultrasound at the posterior RIGHT
hemithorax.

Skin prepped and draped in usual sterile fashion.

Skin and soft tissues anesthetized with 6 mL of 1% lidocaine.

8 French thoracentesis catheter placed into the RIGHT pleural space.

8488 mL of clear yellow fluid aspirated by syringe pump.

Procedure tolerated very well by patient without immediate
complication.
FINDINGS: As above
IMPRESSION: Successful ultrasound guided RIGHT thoracentesis yielding 8488 mL of
pleural fluid.

Patient has a moderate-sized LEFT pleural effusion as well.

If patient remains symptomatic, may consider ultrasound guided LEFT
thoracentesis.

## 2016-01-30 IMAGING — US US THORACENTESIS ASP PLEURAL SPACE W/IMG GUIDE
1 series · 6 of 6 positions shown · non-contrast
Comparison: Prior thoracentesis on 09/08/2014

MEDICATIONS:
None

COMPLICATIONS:
None immediate

INDICATION: Patient with history of prostate cancer, CHF, dyspnea, bilateral
pleural effusions, right greater than left. Request is made for
diagnostic and therapeutic right thoracentesis

EXAM:
ULTRASOUND GUIDED DIAGNOSTIC AND THERAPEUTIC RIGHT THORACENTESIS
TECHNIQUE: Informed written consent was obtained from the patient after a
discussion of the risks, benefits and alternatives to treatment. A
timeout was performed prior to the initiation of the procedure.

[Series 1: us thoracentesis asp pleural space w/img guide · 0.27mm/px · 6 of 6 slices shown]
[im 1/6]
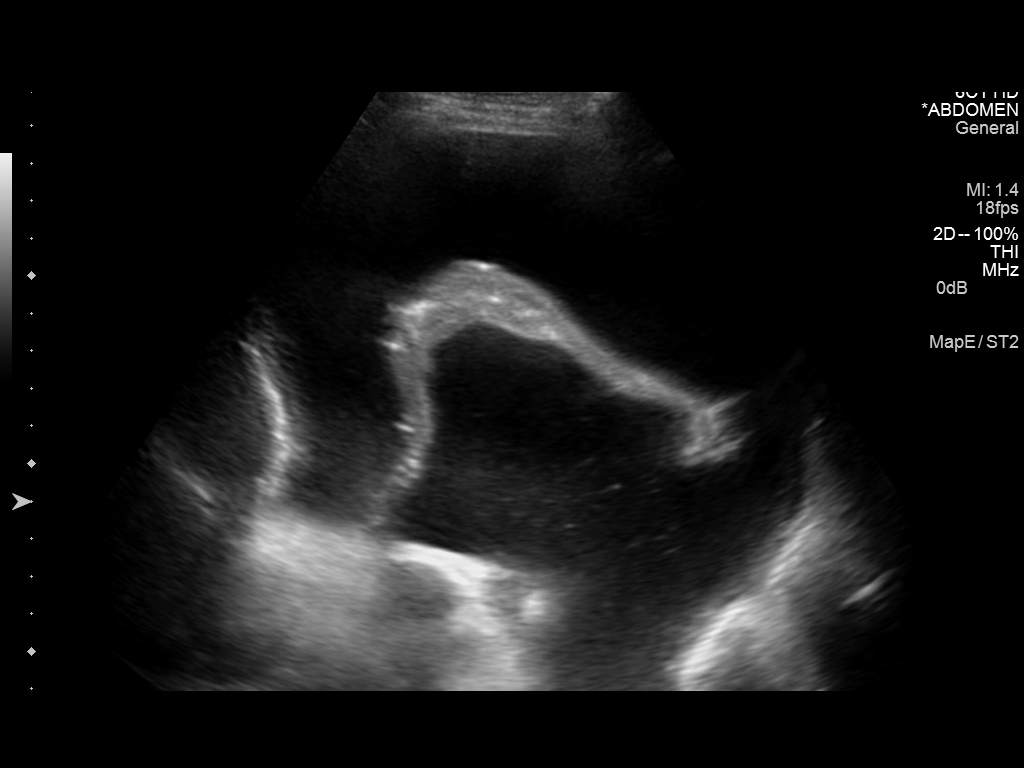
[im 2/6]
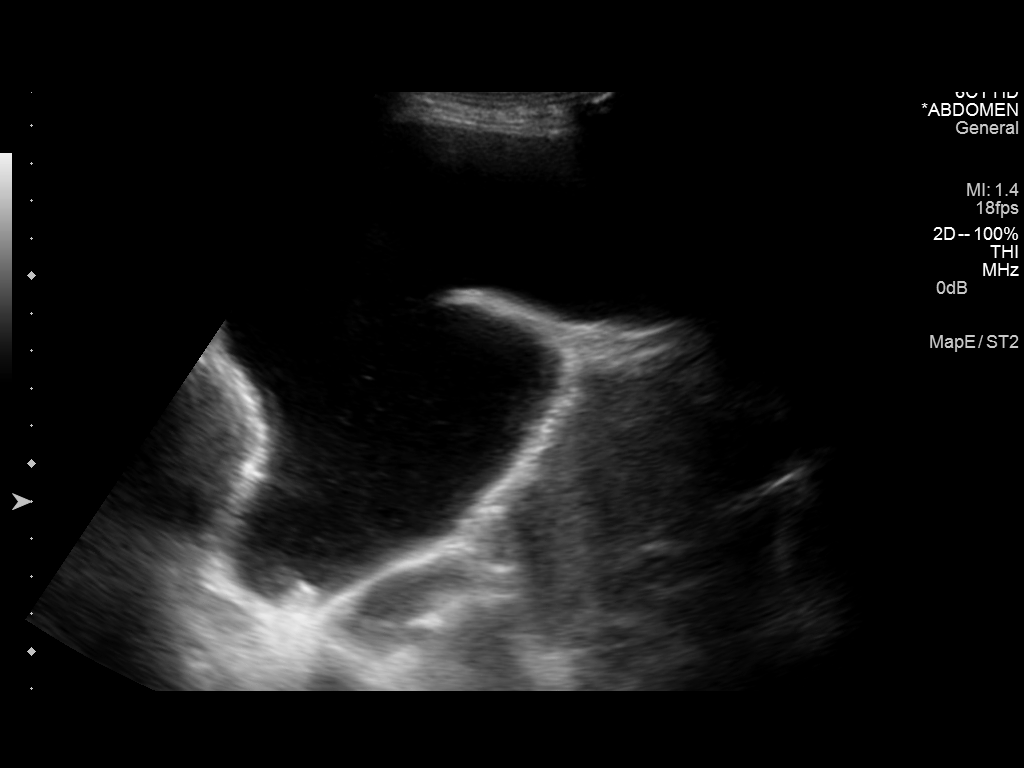
[im 3/6]
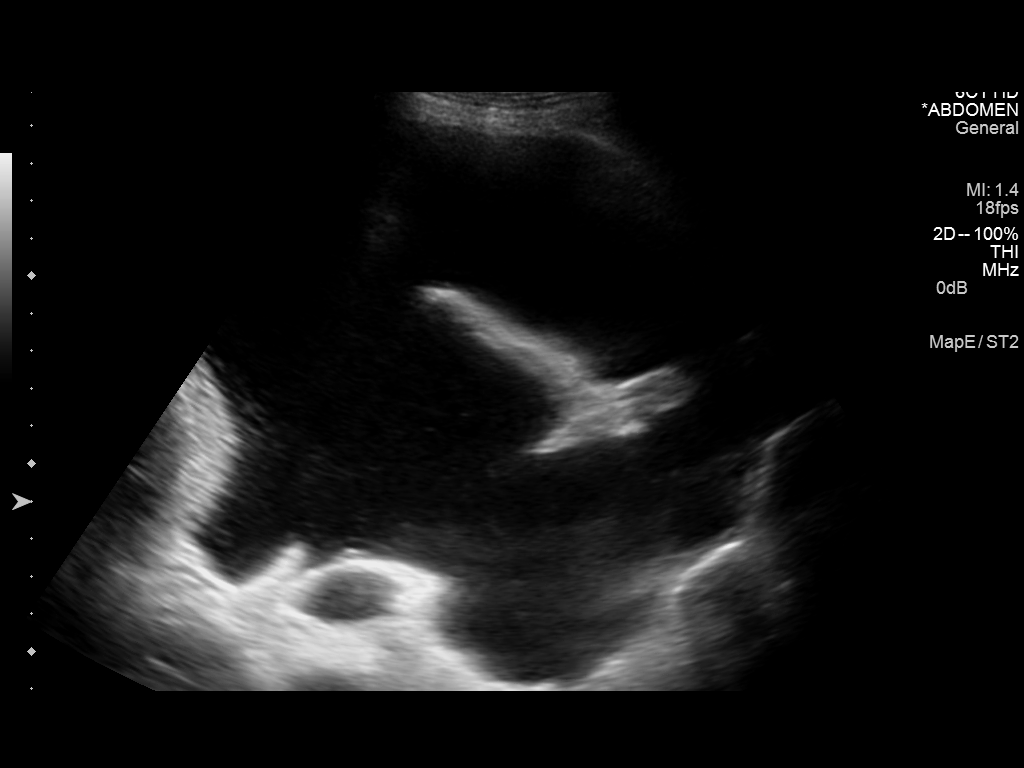
[im 4/6]
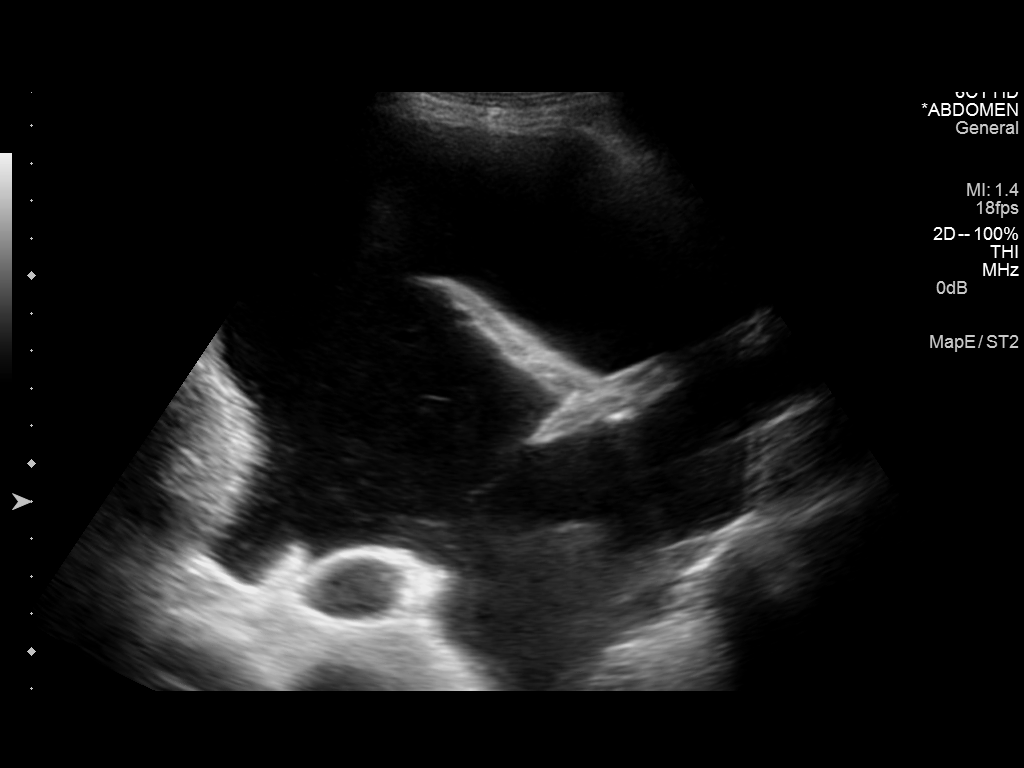
[im 5/6]
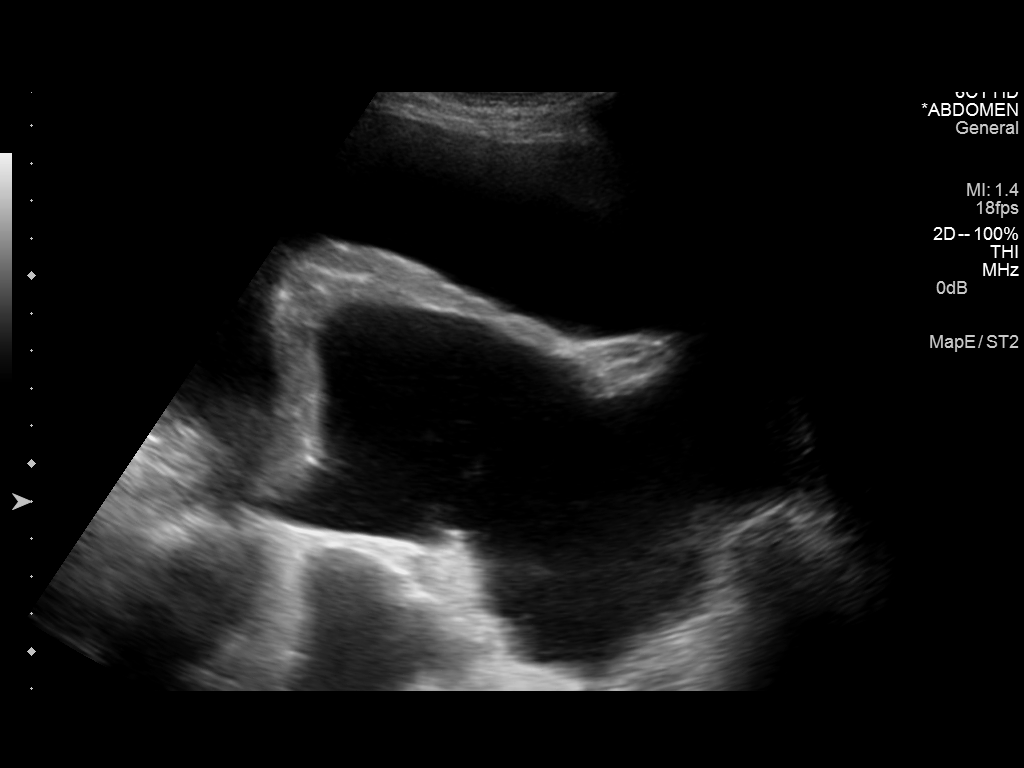
[im 6/6]
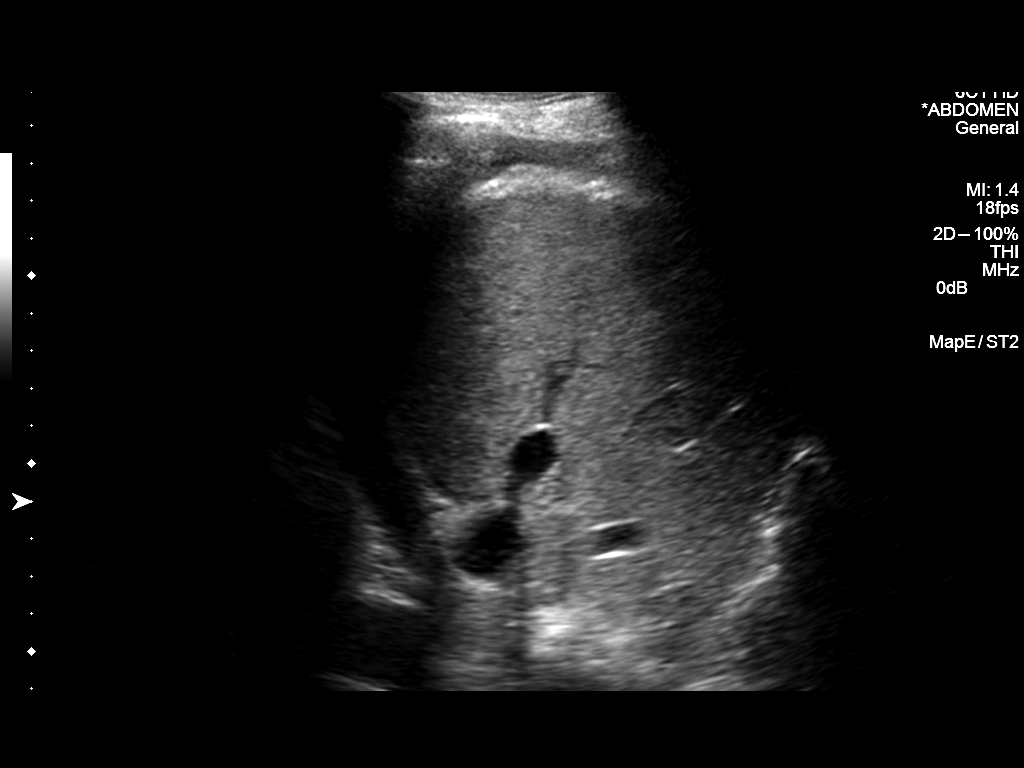

[6 of 6 positions shown; findings below may reference images not displayed]

Initial ultrasound scanning demonstrates a large right pleural
effusion. The lower chest was prepped and draped in the usual
sterile fashion. 1% lidocaine was used for local anesthesia.

An ultrasound image was saved for documentation purposes. An 6 Fr
Safe-T-Centesis catheter was introduced. The thoracentesis was
performed. The catheter was removed and a dressing was applied. The
patient tolerated the procedure well without immediate post
procedural complication. The patient was escorted to have an upright
chest radiograph.
FINDINGS: A total of approximately 1.7 liters of slightly turbid, yellow fluid
was removed. Requested samples were sent to the laboratory.
IMPRESSION: Successful ultrasound-guided diagnostic and therapeutic right sided
thoracentesis yielding 1.7 liters of pleural fluid.

## 2016-02-14 IMAGING — CR DG CHEST 1V PORT
1 series · 1 of 1 positions shown · non-contrast
Comparison: 09/16/2014

CLINICAL DATA: Unresponsive, shortness of breath

EXAM:
PORTABLE CHEST - 1 VIEW

[portable]
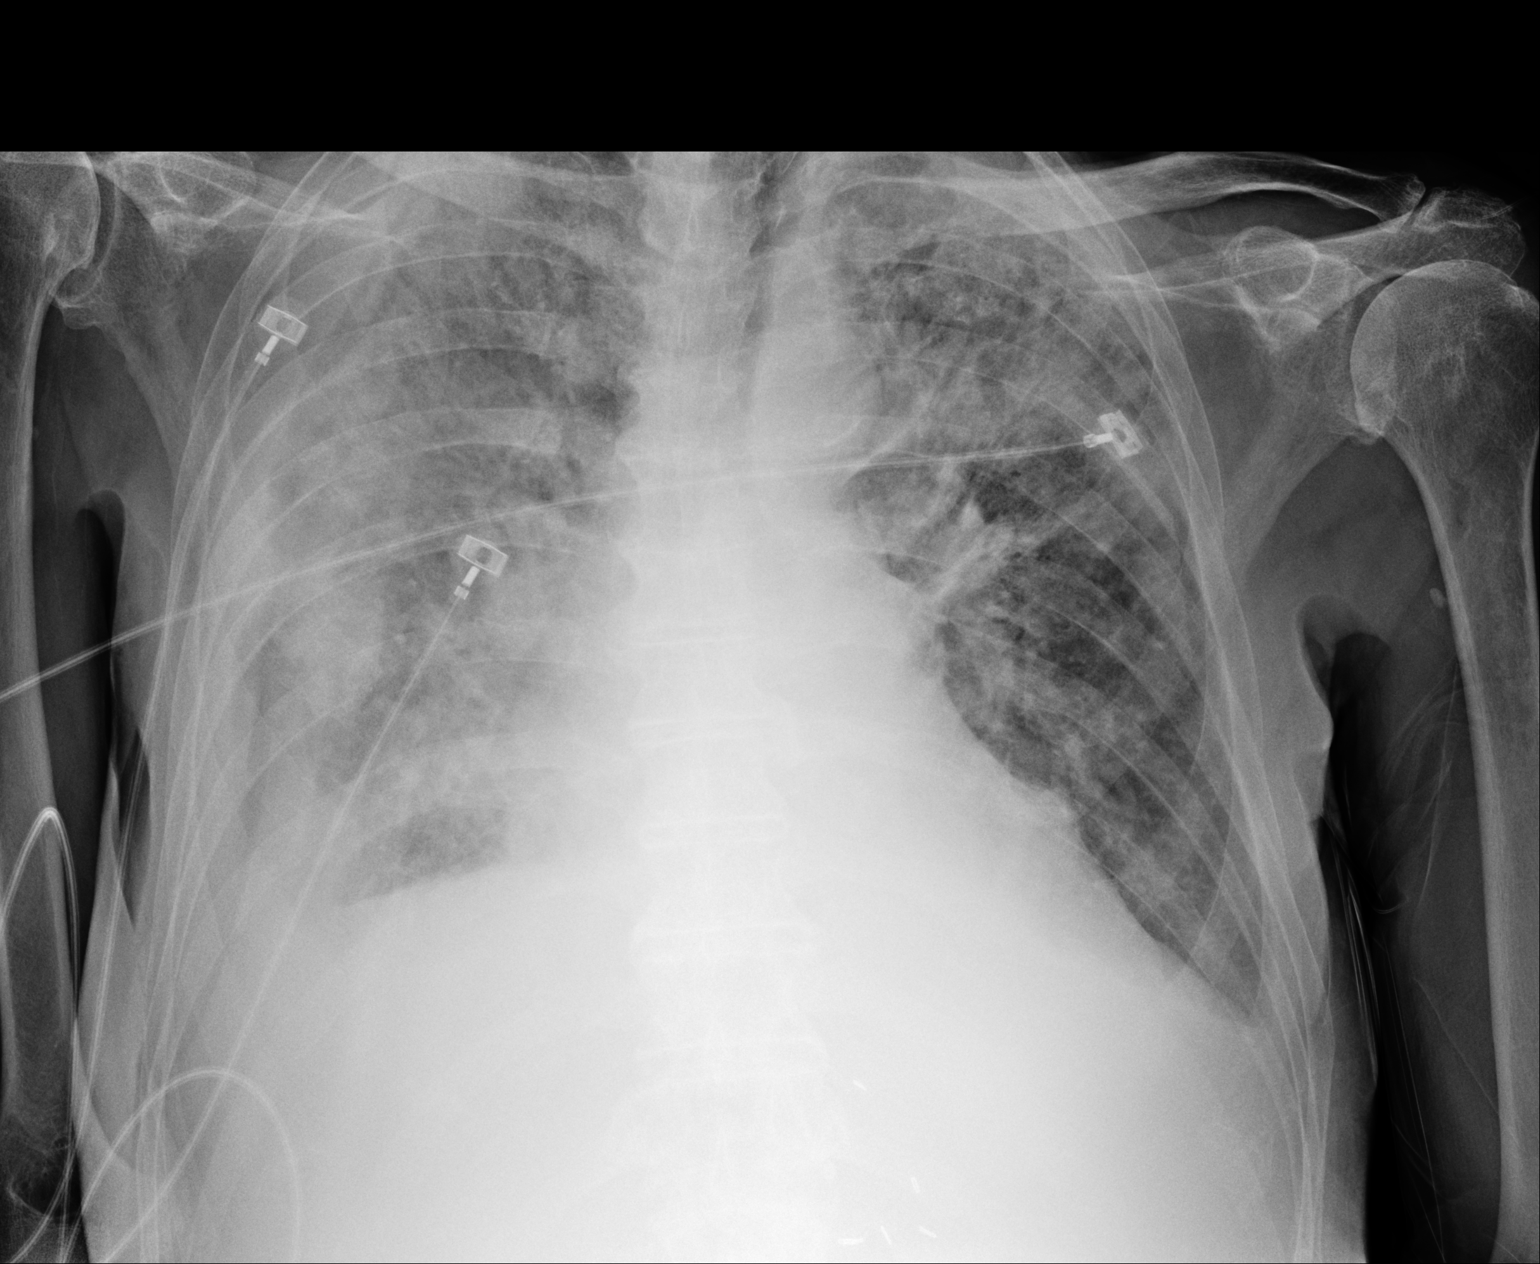

[1 of 1 positions shown; findings below may reference images not displayed]

FINDINGS: Multifocal patchy opacities in the left upper lobe and throughout
the right lung, suspicious for multifocal pneumonia, with possible
superimposed interstitial edema.

Layering right pleural effusion. Suspected small left pleural
effusion.

No pneumothorax. Suspected skin fold along the right lateral
hemithorax.

The heart is top-normal in size.
IMPRESSION: Multifocal patchy opacities in the left upper lobe and throughout
the right lung, progressed from prior chest radiograph, suspicious
for multifocal pneumonia.

Possible superimposed mild interstitial edema.

Layering right pleural effusion. Suspected small left pleural
effusion.
